# Patient Record
Sex: Female | Born: 1975 | Race: White | Hispanic: No | Marital: Married | Smoking: Never smoker
Health system: Southern US, Community
[De-identification: ages and names within clinical notes are randomized; demographics above are authoritative.]

## PROBLEM LIST (undated history)

## (undated) HISTORY — PX: TUBAL LIGATION: SHX77

---

## 2001-04-25 ENCOUNTER — Emergency Department (HOSPITAL_COMMUNITY): Admission: EM | Admit: 2001-04-25 | Discharge: 2001-04-25 | Payer: Self-pay | Admitting: *Deleted

## 2001-04-25 ENCOUNTER — Encounter: Payer: Self-pay | Admitting: *Deleted

## 2001-12-09 ENCOUNTER — Encounter: Payer: Self-pay | Admitting: *Deleted

## 2001-12-09 ENCOUNTER — Ambulatory Visit (HOSPITAL_COMMUNITY): Admission: RE | Admit: 2001-12-09 | Discharge: 2001-12-09 | Payer: Self-pay | Admitting: *Deleted

## 2002-02-11 ENCOUNTER — Ambulatory Visit (HOSPITAL_COMMUNITY): Admission: RE | Admit: 2002-02-11 | Discharge: 2002-02-11 | Payer: Self-pay | Admitting: *Deleted

## 2002-03-10 ENCOUNTER — Inpatient Hospital Stay (HOSPITAL_COMMUNITY): Admission: RE | Admit: 2002-03-10 | Discharge: 2002-03-12 | Payer: Self-pay | Admitting: *Deleted

## 2002-04-28 ENCOUNTER — Ambulatory Visit (HOSPITAL_COMMUNITY): Admission: RE | Admit: 2002-04-28 | Discharge: 2002-04-28 | Payer: Self-pay | Admitting: *Deleted

## 2016-06-29 ENCOUNTER — Emergency Department (HOSPITAL_COMMUNITY)
Admission: EM | Admit: 2016-06-29 | Discharge: 2016-06-29 | Disposition: A | Discharge status: Home / Self Care | Payer: PRIVATE HEALTH INSURANCE | Attending: Emergency Medicine | Admitting: Emergency Medicine

## 2016-06-29 ENCOUNTER — Encounter (HOSPITAL_COMMUNITY): Payer: Self-pay

## 2016-06-29 ENCOUNTER — Emergency Department (HOSPITAL_COMMUNITY): Payer: PRIVATE HEALTH INSURANCE

## 2016-06-29 DIAGNOSIS — S86912A Strain of unspecified muscle(s) and tendon(s) at lower leg level, left leg, initial encounter: Secondary | ICD-10-CM | POA: Diagnosis not present

## 2016-06-29 DIAGNOSIS — Y9389 Activity, other specified: Secondary | ICD-10-CM | POA: Insufficient documentation

## 2016-06-29 DIAGNOSIS — Y999 Unspecified external cause status: Secondary | ICD-10-CM | POA: Diagnosis not present

## 2016-06-29 DIAGNOSIS — W109XXA Fall (on) (from) unspecified stairs and steps, initial encounter: Secondary | ICD-10-CM | POA: Diagnosis not present

## 2016-06-29 DIAGNOSIS — S93402A Sprain of unspecified ligament of left ankle, initial encounter: Secondary | ICD-10-CM | POA: Insufficient documentation

## 2016-06-29 DIAGNOSIS — Y929 Unspecified place or not applicable: Secondary | ICD-10-CM | POA: Insufficient documentation

## 2016-06-29 DIAGNOSIS — S99912A Unspecified injury of left ankle, initial encounter: Secondary | ICD-10-CM | POA: Diagnosis present

## 2016-06-29 MED ORDER — IBUPROFEN 600 MG PO TABS: 600.0000 mg | ORAL_TABLET | Freq: Four times a day (QID) | ORAL | 0 refills | Status: DC

## 2016-06-29 MED ORDER — PROMETHAZINE HCL 12.5 MG PO TABS
25.0000 mg | ORAL_TABLET | Freq: Once | ORAL | Status: AC
  Administered 2016-06-29: 25 mg via ORAL
  Filled 2016-06-29: qty 2

## 2016-06-29 MED ORDER — HYDROCODONE-ACETAMINOPHEN 5-325 MG PO TABS: 1.0000 | ORAL_TABLET | ORAL | 0 refills | Status: DC | PRN

## 2016-06-29 MED ORDER — HYDROCODONE-ACETAMINOPHEN 5-325 MG PO TABS
2.0000 | ORAL_TABLET | Freq: Once | ORAL | Status: AC
  Administered 2016-06-29: 2 via ORAL
  Filled 2016-06-29: qty 2

## 2016-06-29 MED ORDER — PROMETHAZINE HCL 12.5 MG PO TABS: 12.5000 mg | ORAL_TABLET | Freq: Four times a day (QID) | ORAL | 0 refills | Status: DC | PRN

## 2016-06-29 MED ORDER — IBUPROFEN 800 MG PO TABS
800.0000 mg | ORAL_TABLET | Freq: Once | ORAL | Status: AC
Start: 2016-06-29 — End: 2016-06-29
  Administered 2016-06-29: 800 mg via ORAL
  Filled 2016-06-29: qty 1

## 2016-06-29 NOTE — Discharge Instructions (Signed)
Please use the ankle stirrup splint in the knee immobilizer until seen by Dr. Romeo AppleHarrison. Please use the crutches until you can safely apply weight to your lower extremity. Please use the ice pack for the next 48 hours. Keep your left lower extremity elevated above your waist. Use ibuprofen with each meal and at bedtime. May use Norco for more severe pain.This medication may cause drowsiness. Please do not drink, drive, or participate in activity that requires concentration while taking this medication.

## 2016-06-29 NOTE — ED Triage Notes (Signed)
Pt reports she was carrying a 50lb bag of chicken feed today and fell.  C/O pain and swelling to left knee and left ankle. Pedal pulse present.  Abrasion noted to left top of foot.

## 2016-06-29 NOTE — ED Provider Notes (Signed)
[facility] Provider Note   CSN: [id number] Arrival date & time: 06/29/16  1127     History   Chief Complaint Chief Complaint  Patient presents with  . Fall    HPI Kaitlyn Watkins is a 40 y.o. female.  Patient is a 40 year old female who presents to the emergency department with a complaint of left lower extremity pain.  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 Her foot slipped on one of the steps, and she injured the left knee and ankle. She states that when she attempts to put weight on it since this incident she has excruciating pain. She also has severe pain with the 2 areas even at rest. She has not had any previous operations or procedures involving the left lower extremity. She denies any anticoagulation medications, and she has no history of bleeding disorders. She denies any other injury. Movement and standing seem to cause the pain to be worse. Nothing really seems to make the pain any better.      History reviewed. No pertinent past medical history.  There are no active problems to display for this patient.   Past Surgical History:  Procedure Laterality Date  . TUBAL LIGATION      OB History    No data available       Home Medications    Prior to Admission medications   Not on File    Family History No family history on file.  Social History Social History  Substance Use Topics  . Smoking status: Never Smoker  . Smokeless tobacco: Never Used  . Alcohol use Not on file     Comment: occ     Allergies   Review of patient's allergies indicates no known allergies.   Review of Systems Review of Systems  Musculoskeletal: Positive for arthralgias.  All other systems reviewed and are negative.    Physical Exam Updated Vital Signs BP 132/76 (BP Location: Left Arm)   Pulse 75   Temp 98.2 F (36.8 C) (Oral)   Resp 20   Ht 5\' 4"  (1.626 m)   Wt 86.2 kg   LMP 06/17/2016    SpO2 100%   BMI 32.61 kg/m   Physical Exam  Constitutional: She is oriented to person, place, and time. She appears well-developed and well-nourished.  Non-toxic appearance.  HENT:  Head: Normocephalic.  Right Ear: Tympanic membrane and external ear normal.  Left Ear: Tympanic membrane and external ear normal.  Eyes: EOM and lids are normal. Pupils are equal, round, and reactive to light.  Neck: Normal range of motion. Neck supple. Carotid bruit is not present.  Cardiovascular: Normal rate, regular rhythm, normal heart sounds, intact distal pulses and normal pulses.   Pulmonary/Chest: Breath sounds normal. No respiratory distress.  Abdominal: Soft. Bowel sounds are normal. There is no tenderness. There is no guarding.  Musculoskeletal:       Left knee: She exhibits decreased range of motion. Tenderness found. Medial joint line and lateral joint line tenderness noted.       Left ankle: She exhibits no deformity. Tenderness. Medial malleolus tenderness found. Achilles tendon exhibits pain. Achilles tendon exhibits no defect.       Legs:      Feet:  Lymphadenopathy:       Head (right side): No submandibular adenopathy present.       Head (left side): No submandibular adenopathy present.    She has no cervical adenopathy.  Neurological: She is alert and oriented to person, place, and time. She has normal strength. No cranial nerve deficit or sensory deficit.  Skin: Skin is warm and dry.  Psychiatric: She has a normal mood and affect. Her speech is normal.  Nursing note and vitals reviewed.    ED Treatments / Results  Labs (all labs ordered are listed, but only abnormal results are displayed) Labs Reviewed - No data to display  EKG  EKG Interpretation None       Radiology No results found.  Procedures Procedures (including critical care time)  Medications Ordered in ED Medications  promethazine (PHENERGAN) tablet 25 mg (not administered)  HYDROcodone-acetaminophen  (NORCO/VICODIN) 5-325 MG per tablet 2 tablet (not administered)  ibuprofen (ADVIL,MOTRIN) tablet 800 mg (not administered)     Initial Impression / Assessment and Plan / ED Course  I have reviewed the triage vital signs and the nursing notes.  Pertinent labs & imaging results that were available during my care of the patient were reviewed by me and considered in my medical decision making (see chart for details).  Clinical Course    **I have reviewed nursing notes, vital signs, and all appropriate lab and imaging results for this patient.*  Final Clinical Impressions(s) / ED Diagnoses  X-ray of the left knee and left ankle are negative for fracture or dislocation. There does appear to be a small effusion of the left ankle. The patient is fitted with an ankle stirrup splint, as well as a knee immobilizer. The patient is also fitted with crutches. Prescription for ibuprofen and Norco given to the patient. The patient will see Dr. Romeo AppleHarrison for orthopedic evaluation and follow-up concerning the knee and ankle. Patient is excused from work duty until September 28.    Final diagnoses:  Knee strain, left, initial encounter  Left ankle sprain, initial encounter    New Prescriptions New Prescriptions   No medications on file     Ivery QualeHobson Kash [place], PA-C 06/29/16 1341    Glynn OctaveStephen Rancour, MD 06/29/16 1754

## 2020-03-23 ENCOUNTER — Encounter (HOSPITAL_COMMUNITY): Payer: Self-pay | Admitting: *Deleted

## 2020-03-23 ENCOUNTER — Other Ambulatory Visit: Payer: Self-pay

## 2020-03-23 ENCOUNTER — Emergency Department (HOSPITAL_COMMUNITY)
Admission: EM | Admit: 2020-03-23 | Discharge: 2020-03-23 | Disposition: A | Discharge status: Home / Self Care | Payer: 59 | Attending: Emergency Medicine | Admitting: Emergency Medicine

## 2020-03-23 DIAGNOSIS — K8050 Calculus of bile duct without cholangitis or cholecystitis without obstruction: Secondary | ICD-10-CM | POA: Diagnosis not present

## 2020-03-23 DIAGNOSIS — Z20822 Contact with and (suspected) exposure to covid-19: Secondary | ICD-10-CM | POA: Insufficient documentation

## 2020-03-23 DIAGNOSIS — R1011 Right upper quadrant pain: Secondary | ICD-10-CM

## 2020-03-23 LAB — URINALYSIS, ROUTINE W REFLEX MICROSCOPIC
Bilirubin Urine: NEGATIVE
Glucose, UA: NEGATIVE mg/dL
Hgb urine dipstick: NEGATIVE
Ketones, ur: NEGATIVE mg/dL
Nitrite: NEGATIVE
Protein, ur: NEGATIVE mg/dL
Specific Gravity, Urine: 1.011 (ref 1.005–1.030)
pH: 7 (ref 5.0–8.0)

## 2020-03-23 LAB — CBC
HCT: 41.6 % (ref 36.0–46.0)
Hemoglobin: 13.5 g/dL (ref 12.0–15.0)
MCH: 28.4 pg (ref 26.0–34.0)
MCHC: 32.5 g/dL (ref 30.0–36.0)
MCV: 87.6 fL (ref 80.0–100.0)
Platelets: 250 10*3/uL (ref 150–400)
RBC: 4.75 MIL/uL (ref 3.87–5.11)
RDW: 13.4 % (ref 11.5–15.5)
WBC: 9.9 10*3/uL (ref 4.0–10.5)
nRBC: 0 % (ref 0.0–0.2)

## 2020-03-23 LAB — COMPREHENSIVE METABOLIC PANEL
ALT: 19 U/L (ref 0–44)
AST: 15 U/L (ref 15–41)
Albumin: 4.7 g/dL (ref 3.5–5.0)
Alkaline Phosphatase: 56 U/L (ref 38–126)
Anion gap: 10 (ref 5–15)
BUN: 13 mg/dL (ref 6–20)
CO2: 21 mmol/L — ABNORMAL LOW (ref 22–32)
Calcium: 9.6 mg/dL (ref 8.9–10.3)
Chloride: 108 mmol/L (ref 98–111)
Creatinine, Ser: 0.64 mg/dL (ref 0.44–1.00)
GFR calc Af Amer: >60 mL/min (ref >60)
GFR calc non Af Amer: >60 mL/min (ref >60)
Glucose, Bld: 86 mg/dL (ref 70–99)
Potassium: 3.3 mmol/L — ABNORMAL LOW (ref 3.5–5.1)
Sodium: 139 mmol/L (ref 135–145)
Total Bilirubin: 0.6 mg/dL (ref 0.3–1.2)
Total Protein: 7.8 g/dL (ref 6.5–8.1)

## 2020-03-23 LAB — SARS CORONAVIRUS 2 BY RT PCR (HOSPITAL ORDER, PERFORMED IN ~~LOC~~ HOSPITAL LAB): SARS Coronavirus 2: NEGATIVE

## 2020-03-23 LAB — LIPASE, BLOOD: Lipase: 27 U/L (ref 11–51)

## 2020-03-23 LAB — PREGNANCY, URINE: Preg Test, Ur: NEGATIVE

## 2020-03-23 MED ORDER — ONDANSETRON HCL 4 MG/2ML IJ SOLN
4.0000 mg | Freq: Once | INTRAMUSCULAR | Status: AC
  Administered 2020-03-23: 4 mg via INTRAVENOUS
  Filled 2020-03-23: qty 2

## 2020-03-23 MED ORDER — FENTANYL CITRATE (PF) 100 MCG/2ML IJ SOLN
50.0000 ug | Freq: Once | INTRAMUSCULAR | Status: AC
  Administered 2020-03-23: 50 ug via INTRAVENOUS
  Filled 2020-03-23: qty 2

## 2020-03-23 MED ORDER — SODIUM CHLORIDE 0.9% FLUSH: 3.0000 mL | Freq: Once | INTRAVENOUS | Status: DC

## 2020-03-23 NOTE — ED Triage Notes (Signed)
Pt states she was at work earlier today and started to have severe right upper abdominal pain; pt states the pain is intermittent and states she has had nausea

## 2020-03-23 NOTE — ED Provider Notes (Signed)
[facility] Provider Note   CSN: [id number] Arrival date & time: 03/23/20  1536     History Chief Complaint  Patient presents with  . Abdominal Pain    Kaitlyn Watkins is a 44 y.o. female who presents emergency department with chief complaint of right upper quadrant abdominal pain.  Patient states that she was at work this morning when she started getting right upper quadrant pain which has progressively worsened.  It is constant, colicky, severe.  She has had associated nausea without active vomiting.  Patient states that she has had on and off episodes of similar symptoms for the past several years however this is the worst episode she has had.  Her husband who is at bedside and provides history states "I think it is her gallbladder."  She denies a history of acid reflux.  The pain does not radiate.  It is not worse with deep breathing.  She seems to get a little relief when she applies pressure to the area.  Her pain has improved but is still moderate to severe.  She denies flank pain, fevers, urinary symptoms, diarrhea or constipation.  HPI     History reviewed. No pertinent past medical history.  There are no problems to display for this patient.   Past Surgical History:  Procedure Laterality Date  . TUBAL LIGATION       OB History   No obstetric history on file.     History reviewed. No pertinent family history.  Social History   Tobacco Use  . Smoking status: Never Smoker  . Smokeless tobacco: Never Used  Substance Use Topics  . Alcohol use: Yes    Comment: occ  . Drug use: No    Home Medications Prior to Admission medications   Medication Sig Start Date End Date Taking? Authorizing Provider  HYDROcodone-acetaminophen (NORCO/VICODIN) 5-325 MG tablet Take 1 tablet by mouth every 4 (four) hours as needed. 06/29/16   Lily Kocher, PA-C  ibuprofen (ADVIL,MOTRIN) 600 MG tablet Take 1 tablet (600 mg total) by mouth 4 (four) times  daily. Take with breakfast, lunch, supper, and at hs 06/29/16   Lily Kocher, PA-C  promethazine (PHENERGAN) 12.5 MG tablet Take 1 tablet (12.5 mg total) by mouth every 6 (six) hours as needed for nausea or vomiting. 06/29/16   Lily Kocher, PA-C    Allergies    Patient has no known allergies.  Review of Systems   Review of Systems Ten systems reviewed and are negative for acute change, except as noted in the HPI.   Physical Exam Updated Vital Signs BP 134/83   Pulse (!) 55   Temp 98.2 F (36.8 C) (Oral)   Resp 18   Ht 5\' 4"  (1.626 m)   Wt 90.7 kg   LMP 02/17/2020   SpO2 100%   BMI 34.33 kg/m   Physical Exam Vitals and nursing note reviewed.  Constitutional:      General: She is not in acute distress.    Appearance: She is well-developed. She is not diaphoretic.  HENT:     Head: Normocephalic and atraumatic.  Eyes:     General: No scleral icterus.    Conjunctiva/sclera: Conjunctivae normal.  Cardiovascular:     Rate and Rhythm: Normal rate and regular rhythm.     Heart sounds: Normal heart sounds. No murmur heard.  No friction rub. No gallop.   Pulmonary:     Effort: Pulmonary effort is normal. No respiratory distress.     Breath  sounds: Normal breath sounds.  Abdominal:     General: Bowel sounds are normal. There is no distension.     Palpations: Abdomen is soft. There is no mass.     Tenderness: There is abdominal tenderness in the right upper quadrant. There is no right CVA tenderness, left CVA tenderness or guarding. Positive signs include Murphy's sign.  Musculoskeletal:     Cervical back: Normal range of motion.  Skin:    General: Skin is warm and dry.  Neurological:     Mental Status: She is alert and oriented to person, place, and time.  Psychiatric:        Behavior: Behavior normal.     ED Results / Procedures / Treatments   Labs (all labs ordered are listed, but only abnormal results are displayed) Labs Reviewed  COMPREHENSIVE METABOLIC PANEL -  Abnormal; Notable for the following components:      Result Value   Potassium 3.3 (*)    CO2 21 (*)    All other components within normal limits  URINALYSIS, ROUTINE W REFLEX MICROSCOPIC - Abnormal; Notable for the following components:   Leukocytes,Ua SMALL (*)    Bacteria, UA RARE (*)    All other components within normal limits  SARS CORONAVIRUS 2 BY RT PCR (HOSPITAL ORDER, PERFORMED IN [facility])  LIPASE, BLOOD  CBC  PREGNANCY, URINE    EKG None  Radiology US ABDOMEN LIMITED RUQ  Result Date: 03/24/2020 CLINICAL DATA:  Recurrent right upper quadrant pain EXAM: ULTRASOUND ABDOMEN LIMITED RIGHT UPPER QUADRANT COMPARISON:  None. FINDINGS: Gallbladder: No gallstones or wall thickening visualized. No sonographic Murphy sign noted by sonographer. Common bile duct: Diameter: 5.6 mm Liver: No focal lesion identified. Within normal limits in parenchymal echogenicity. Portal vein is patent on color Doppler imaging with normal direction of blood flow towards the liver. Other: None. IMPRESSION: 1. Normal exam. Electronically Signed   By: Signa Kell M.D.   On: 03/24/2020 11:31    Procedures Procedures (including critical care time)  Medications Ordered in ED Medications  fentaNYL (SUBLIMAZE) injection 50 mcg (50 mcg Intravenous Given 03/23/20 1826)  ondansetron (ZOFRAN) injection 4 mg (4 mg Intravenous Given 03/23/20 1824)    ED Course  I have reviewed the triage vital signs and the nursing notes.  Pertinent labs & imaging results that were available during my care of the patient were reviewed by me and considered in my medical decision making (see chart for details).    MDM Rules/Calculators/A&P                           This patient complains of RUQ abd pain, this involves an extensive number of treatment options, and is a complaint that carries with it a high risk of complications and morbidity.  The differential diagnosis includes The emergent DDX for RUQ pain  includes but is not limited to Glabladder disease, PUD, Acute Hepatitis, Pancreatitis, pyelonephritis, Pneumonia, Lower lobe PE/Infarct, Kidney stone, GERD, retrocecal appendicitis, Fitz-Hugh-Curtis syndrome, AAA, MI, Zoster.   I Ordered, reviewed, and interpreted labs, which included CBC, pregnancy test, Covid test, lipase which are all within normal limits.  CMP shows a mildly decreased potassium level.  AST and ALT along with alkaline phosphatase also within normal limits. I ordered medication fentanyl and Zofran for pain and nausea I ordered imaging studies which included right upper quadrant abdominal ultrasound, however ultrasound unavailable at this time.  Additional history obtained from patient's husband  at bedside Previous records obtained and reviewed    Critical interventions: Pain medication, nausea medication  After the interventions stated above, I reevaluated the patient and found near complete resolution of patient's symptoms   Patient symptoms are concerning for biliary colic.  In shared decision-making the patient declined CT scan and would like to return tomorrow morning for right upper quadrant abdominal ultrasound.  Given the lab findings and resolution of pain I feel that this is okay and that her symptoms do not warrant emergent intervention.  She is not actively vomiting.  She has no elevation in her liver enzymes.  The patient will return tomorrow for evaluation of her gallbladder with right upper quadrant ultrasound.  I have given her referral to general surgery.  They may wish to have additional testing such as a HIDA scan.  I discussed this with the patient.  Patient advised to not go long periods of time without eating and to decrease fatty intake.  Patient appears otherwise appropriate for discharge with follow-up ultrasound tomorrow morning.  Discussed return precautions.  Final Clinical Impression(s) / ED Diagnoses Final diagnoses:  RUQ abdominal pain  Biliary  colic    Rx / DC Orders ED Discharge Orders         Ordered    US ABDOMEN LIMITED RUQ     Discontinue     03/23/20 1945           Arthor Captain, PA-C 03/24/20 1315    Jacalyn Lefevre, MD 03/24/20 1500

## 2020-03-23 NOTE — Discharge Instructions (Signed)
Contact a health care provider if: Your pain lasts more than 5 hours. You vomit. You have a fever and chills. Your pain gets worse. Get help right away if: Your skin or the whites of your eyes look yellow (jaundice). Your have tea-colored urine and light-colored stools. You are dizzy or you faint. 

## 2020-03-24 ENCOUNTER — Ambulatory Visit (HOSPITAL_COMMUNITY)
Admission: RE | Admit: 2020-03-24 | Discharge: 2020-03-24 | Disposition: A | Discharge status: Home / Self Care | Payer: 59 | Source: Ambulatory Visit | Attending: Emergency Medicine | Admitting: Emergency Medicine

## 2020-03-24 DIAGNOSIS — R1011 Right upper quadrant pain: Secondary | ICD-10-CM | POA: Diagnosis present

## 2020-03-24 IMAGING — US US ABDOMEN LIMITED
1 series · 14 of 25 positions shown · non-contrast
Comparison: None.

CLINICAL DATA: Recurrent right upper quadrant pain

EXAM:
ULTRASOUND ABDOMEN LIMITED RIGHT UPPER QUADRANT

[Series 1: us abdomen limited · 14 of 48 slices shown]
[im 1/48]
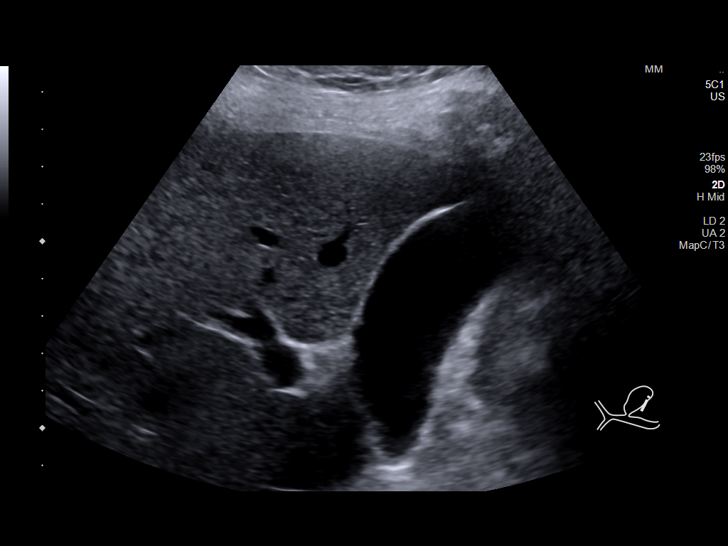
[im 4/48]
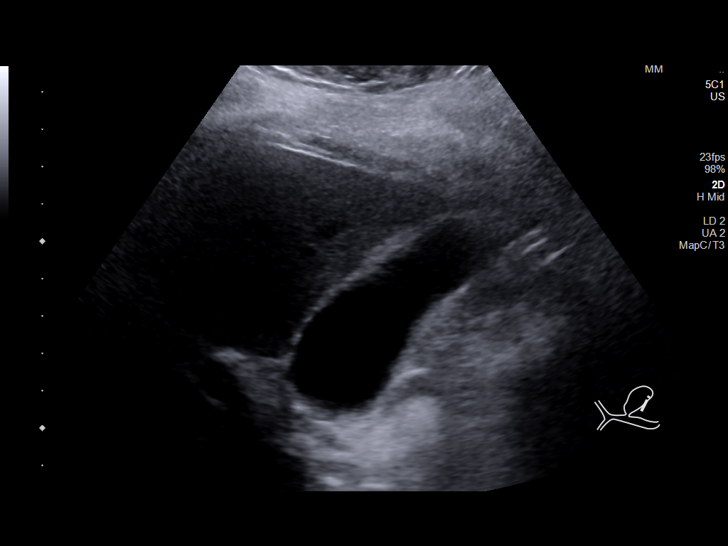
[im 8/48]
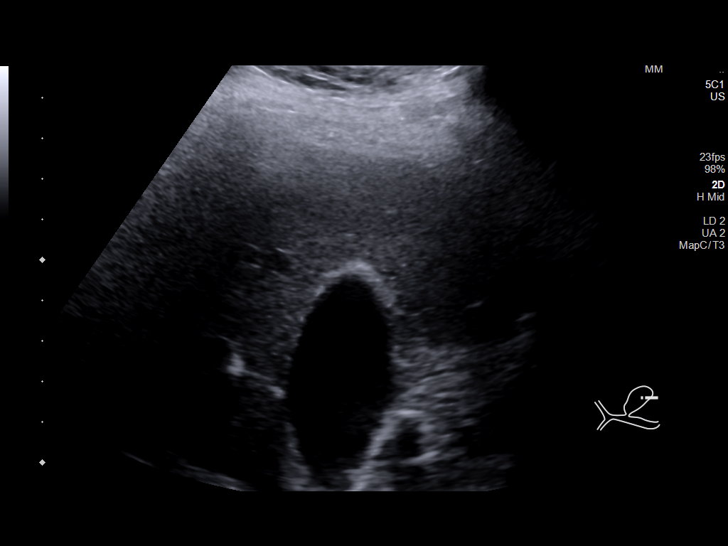
[im 12/48]
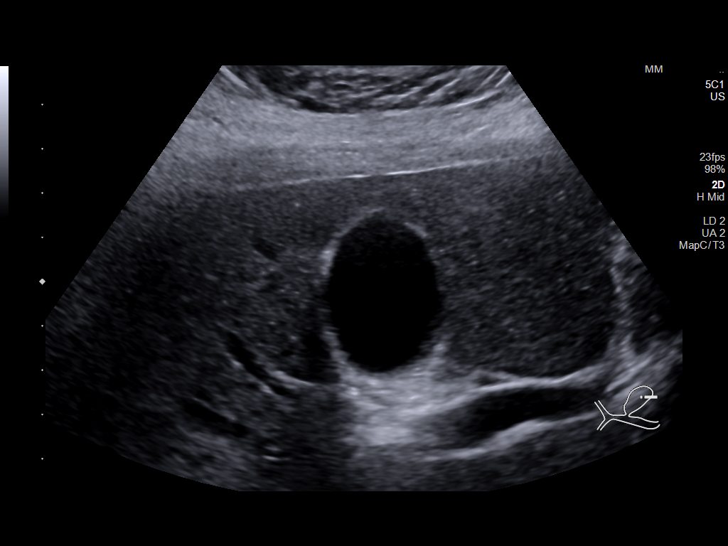
[im 16/48]
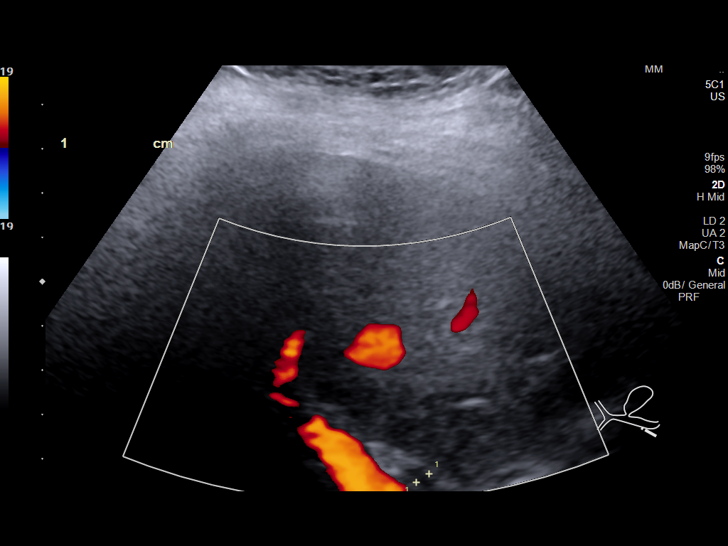
[im 18/48]
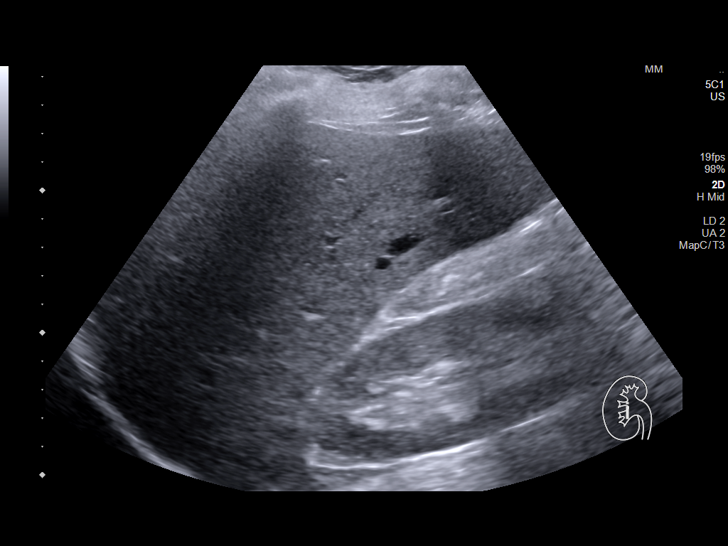
[im 22/48]
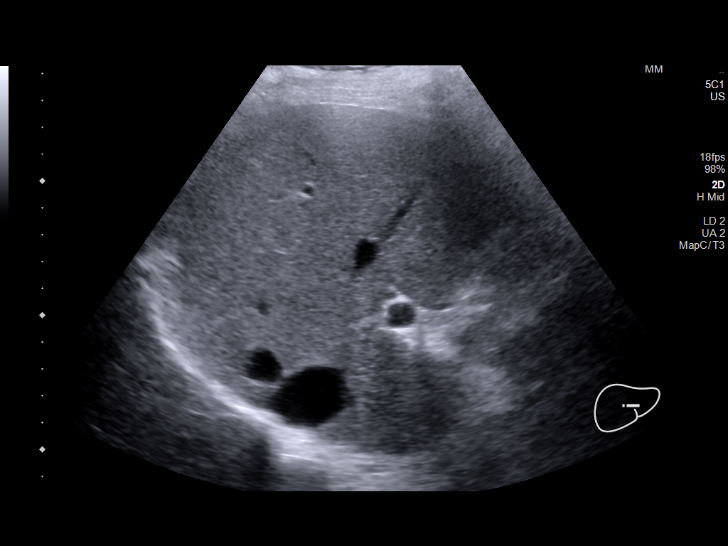
[im 26/48]
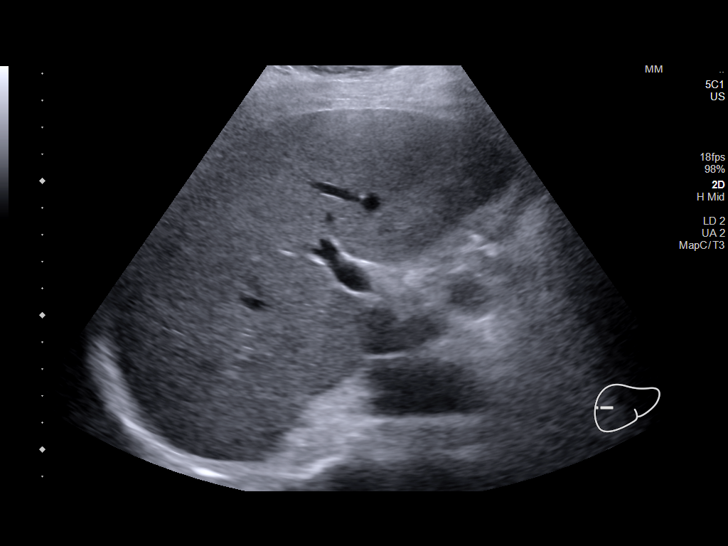
[im 30/48]
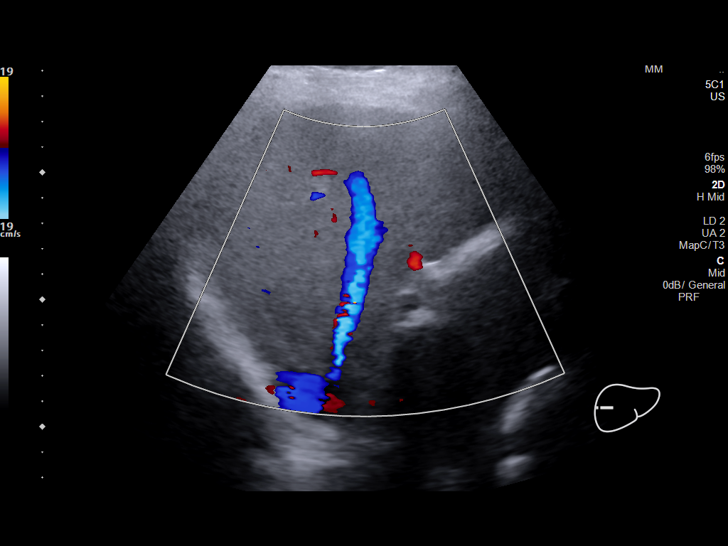
[im 32/48]
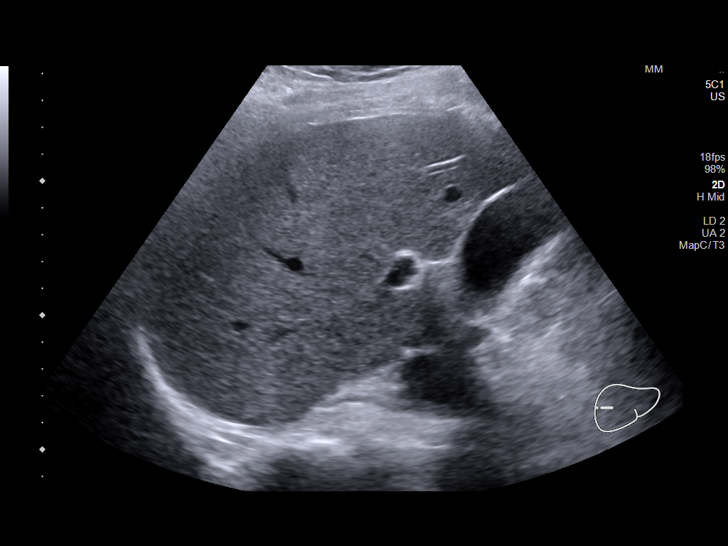
[im 36/48]
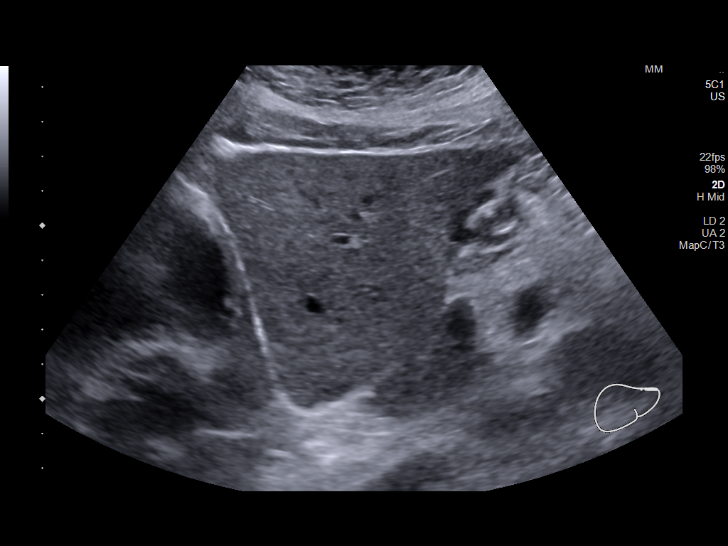
[im 40/48]
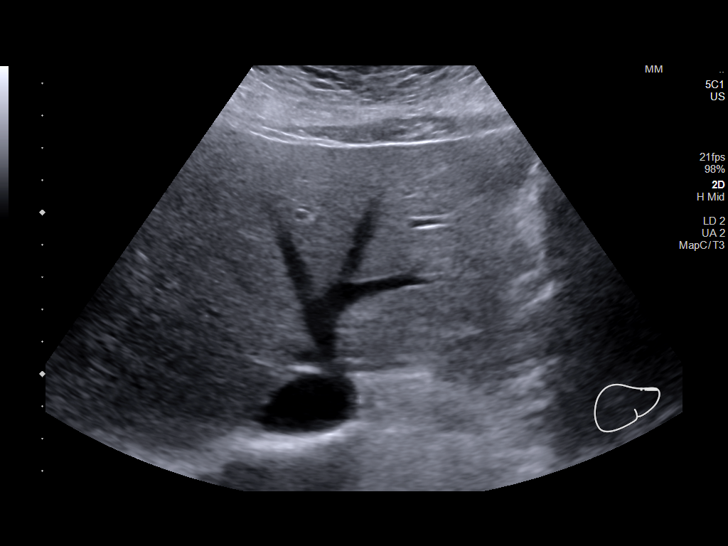
[im 44/48]
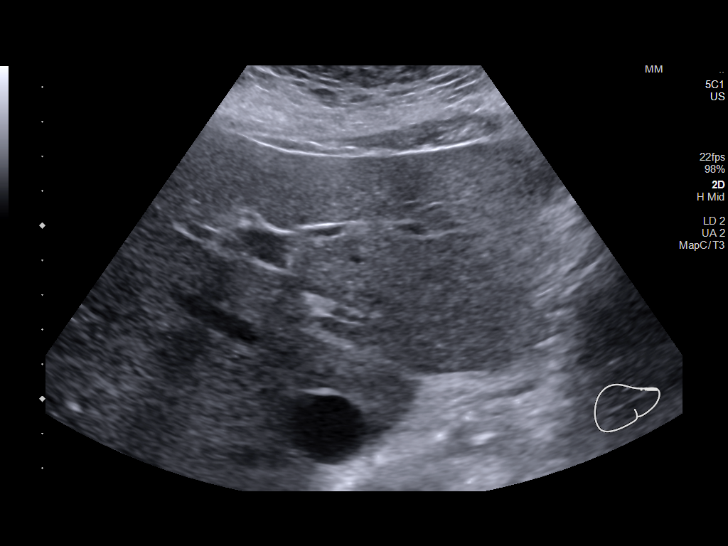
[im 48/48]
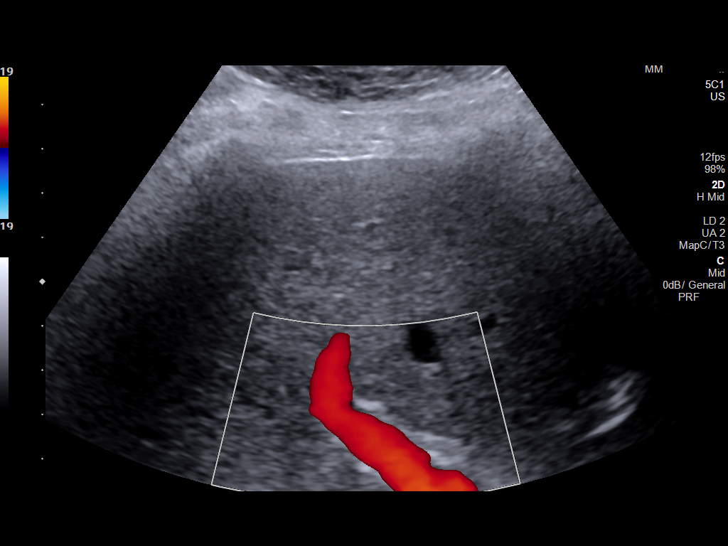

[14 of 25 positions shown; findings below may reference images not displayed]

FINDINGS: Gallbladder:

No gallstones or wall thickening visualized. No sonographic Murphy
sign noted by sonographer.

Common bile duct:

Diameter: 5.6 mm

Liver:

No focal lesion identified. Within normal limits in parenchymal
echogenicity. Portal vein is patent on color Doppler imaging with
normal direction of blood flow towards the liver.

Other: None.
IMPRESSION: 1. Normal exam.

## 2020-03-24 NOTE — ED Provider Notes (Signed)
Patient return for follow-up ultrasound ordered by Kaitlyn Watkins.  I personally reviewed her imaging results.  Right upper quadrant ultrasound was without any acute abnormalities, no stones noted, normal Doppler flow.  Previous provider had referred her to general surgery for follow-up.  When discussing with the patient, she states that she has already called and made an appointment with them reports mild soreness to the right upper quadrant but significant decrease in pain..  I encouraged her to keep this appointment for further evaluation.  Patient voices understanding and is agreeable.  Patient is stable for DC to home.   Mare Ferrari, PA-C 03/24/20 1213    Vanetta Mulders, MD 03/25/20 1310

## 2020-04-05 ENCOUNTER — Encounter: Payer: Self-pay | Admitting: General Surgery

## 2020-04-05 ENCOUNTER — Other Ambulatory Visit: Payer: Self-pay

## 2020-04-05 ENCOUNTER — Ambulatory Visit (INDEPENDENT_AMBULATORY_CARE_PROVIDER_SITE_OTHER): Payer: PRIVATE HEALTH INSURANCE | Admitting: General Surgery

## 2020-04-05 VITALS — BP 125/75 | HR 68 | Temp 96.9°F | Resp 14 | Ht 64.0 in | Wt 200.0 lb

## 2020-04-05 DIAGNOSIS — R1011 Right upper quadrant pain: Secondary | ICD-10-CM | POA: Diagnosis not present

## 2020-04-05 NOTE — Patient Instructions (Signed)
Biliary Colic, Adult  Biliary colic is severe pain caused by a problem with a small organ in the upper right part of your belly (gallbladder). The gallbladder stores a digestive fluid produced in the liver (bile) that helps the body break down fat. Bile and other digestive enzymes are carried from the liver to the small intestine through tube-like structures (bile ducts). The gallbladder and the bile ducts form the biliary tract. Sometimes hard deposits of digestive fluids form in the gallbladder (gallstones) and block the flow of bile from the gallbladder, causing biliary colic. This condition is also called a gallbladder attack. Gallstones can be as small as a grain of sand or as big as a golf ball. There could be just one gallstone in the gallbladder, or there could be many. What are the causes? Biliary colic is usually caused by gallstones. Less often, a tumor could block the flow of bile from the gallbladder and trigger biliary colic. What increases the risk? This condition is more likely to develop in:  Women.  People of Hispanic descent.  People with a family history of gallstones.  People who are obese.  People who suddenly or quickly lose weight.  People who eat a high-calorie, low-fiber diet that is rich in refined carbs (carbohydrates), such as white bread and white rice.  People who have an intestinal disease that affects nutrient absorption, such as Crohn disease.  People who have a metabolic condition, such as metabolic syndrome or diabetes. What are the signs or symptoms? Severe pain in the upper right side of the belly is the main symptom of biliary colic. You may feel this pain below the chest but above the hip. This pain often occurs at night or after eating a very fatty meal. This pain may get worse for up to an hour and last as long as 12 hours. In most cases, the pain fades (subsides) within a couple hours. Other symptoms of this condition include:  Nausea and  vomiting.  Pain under the right shoulder. How is this diagnosed? This condition is diagnosed based on your medical history, your symptoms, and a physical exam. You may have tests, including:  Blood tests to rule out infection or inflammation of the bile ducts, gallbladder, pancreas, or liver.  Imaging studies such as: ? Ultrasound. ? CT scan. ? MRI. In some cases, you may need to have an imaging study done using a small amount of radioactive material (nuclear medicine) to confirm the diagnosis. How is this treated? Treatment for this condition may include medicine to relieve your pain or nausea. If you have gallstones that are causing biliary colic, you may need surgery to remove the gallbladder (cholecystectomy). Gallstones can also be dissolved gradually with medicine. It may take months or years before the gallstones are completely gone. Follow these instructions at home:  Take over-the-counter and prescription medicines only as told by your health care provider.  Drink enough fluid to keep your urine clear or pale yellow.  Follow instructions from your health care provider about eating or drinking restrictions. These may include avoiding: ? Fatty, greasy, and fried foods. ? Any foods that make the pain worse. ? Overeating. ? Having a large meal after not eating for a while.  Keep all follow-up visits as told by your health care provider. This is important. How is this prevented? Steps to prevent this condition include:  Maintaining a healthy body weight.  Getting regular exercise.  Eating a healthy, high-fiber, low-fat diet.  Limiting how much   sugar and refined carbs you eat, such as sweets, white flour, and white rice. Contact a health care provider if:  Your pain lasts more than 5 hours.  You vomit.  You have a fever and chills.  Your pain gets worse. Get help right away if:  Your skin or the whites of your eyes look yellow (jaundice).  Your have tea-colored  urine and light-colored stools.  You are dizzy or you faint. Summary  Biliary colic is severe pain caused by a problem with a small organ in the upper right part of your belly (gallbladder).  Treatments for this condition include medicines that relieves your pain or nausea and medicines that slowly dissolves the gallstones.  If gallstones cause your biliary colic, the treatment is surgery to remove the gallbladder (cholecystectomy). This information is not intended to replace advice given to you by your health care provider. Make sure you discuss any questions you have with your health care provider. Document Revised: 09/06/2017 Document Reviewed: 04/09/2016 Elsevier Patient Education  2020 [employer]  

## 2020-04-06 ENCOUNTER — Telehealth: Payer: Self-pay | Admitting: Family Medicine

## 2020-04-06 NOTE — Progress Notes (Signed)
Kaitlyn Watkins; [id number]; [birth year]   HPI Patient is a 44 year old white female who was referred to my care by Emilio Math of the [facility] for evaluation treatment of recurrent right upper quadrant abdominal pain.  Patient was recently seen in the emergency room for right upper quadrant abdominal pain.  Ultrasound of the gallbladder was negative.  She has had persistent and more frequent episodes of right upper quadrant abdominal pain which radiate around her right flank to her back, bloating, and indigestion along with fatty food intolerance.  She has decreased the amount of fatty foods that she eats.  She has limited her diet to try to prevent recurrent episodes.  She denies any fever, chills, or jaundice.  She currently has minimal abdominal pain. History reviewed. No pertinent past medical history.  Past Surgical History:  Procedure Laterality Date  . TUBAL LIGATION      Family History  Problem Relation Age of Onset  . Hypertension Mother   . Diabetes Father   . CAD Father     Current Outpatient Medications on File Prior to Visit  Medication Sig Dispense Refill  . diphenhydrAMINE (BENADRYL) 25 MG tablet Take 25 mg by mouth daily.     No current facility-administered medications on file prior to visit.    No Known Allergies  Social History   Substance and Sexual Activity  Alcohol Use Yes   Comment: occ    Social History   Tobacco Use  Smoking Status Never Smoker  Smokeless Tobacco Never Used    Review of Systems  Constitutional: Negative.   HENT: Positive for sinus pain.   Eyes: Negative.   Respiratory: Negative.   Cardiovascular: Negative.   Gastrointestinal: Positive for heartburn and nausea.  Genitourinary: Negative.   Musculoskeletal: Positive for joint pain.  Skin: Negative.   Neurological: Negative.   Endo/Heme/Allergies: Negative.   Psychiatric/Behavioral: Negative.     Objective   Vitals:   04/05/20 1325  BP:  125/75  Pulse: 68  Resp: 14  Temp: (!) 96.9 F (36.1 C)  SpO2: 97%    Physical Exam Vitals reviewed.  Constitutional:      Appearance: Normal appearance. She is not ill-appearing.  HENT:     Head: Normocephalic and atraumatic.  Eyes:     General: No scleral icterus. Cardiovascular:     Rate and Rhythm: Normal rate and regular rhythm.     Heart sounds: Normal heart sounds. No murmur heard.  No friction rub. No gallop.   Pulmonary:     Effort: Pulmonary effort is normal. No respiratory distress.     Breath sounds: Normal breath sounds. No stridor. No wheezing, rhonchi or rales.  Abdominal:     General: Bowel sounds are normal. There is no distension.     Palpations: Abdomen is soft. There is no mass.     Tenderness: There is abdominal tenderness. There is no guarding or rebound.     Hernia: No hernia is present.     Comments: Minimal tenderness to deep palpation in the right upper quadrant.  Skin:    General: Skin is warm and dry.  Neurological:     Mental Status: She is alert and oriented to person, place, and time.   ER notes, ultrasound report reviewed  Assessment  Right upper quadrant abdominal pain.  Ultrasound negative for cholelithiasis Plan   We will get HIDA scan with ejection fraction to assess the gallbladder.  Further management is pending those results.

## 2020-04-06 NOTE — Telephone Encounter (Signed)
Called insurance to get precert for Hida Scan CPT# 28979 - spoke to [place] with ref# [id number]. She transferred me to [facility] s/t Renee C. She states that this does not need precert and auth# is [id number]/30/2021. Called and schedule apt for scan.Pt is scheduled for 04/12/2020 at 10 Am NPO after midnight. Pt called and made aware.

## 2020-04-12 ENCOUNTER — Other Ambulatory Visit: Payer: Self-pay

## 2020-04-12 ENCOUNTER — Encounter (HOSPITAL_COMMUNITY): Payer: Self-pay

## 2020-04-12 ENCOUNTER — Ambulatory Visit (HOSPITAL_COMMUNITY)
Admission: RE | Admit: 2020-04-12 | Discharge: 2020-04-12 | Disposition: A | Discharge status: Home / Self Care | Payer: 59 | Source: Ambulatory Visit | Attending: General Surgery | Admitting: General Surgery

## 2020-04-12 DIAGNOSIS — R1011 Right upper quadrant pain: Secondary | ICD-10-CM | POA: Diagnosis present

## 2020-04-12 MED ORDER — TECHNETIUM TC 99M MEBROFENIN IV KIT
5.0000 | PACK | Freq: Once | INTRAVENOUS | Status: AC | PRN
  Administered 2020-04-12: 5.3 via INTRAVENOUS

## 2020-04-14 NOTE — H&P (Signed)
Kaitlyn Watkins; [id number]; [birth year]   HPI Patient is a 44 year old white female who was referred to my care by Emilio Math of the [facility] for evaluation treatment of recurrent right upper quadrant abdominal pain.  Patient was recently seen in the emergency room for right upper quadrant abdominal pain.  Ultrasound of the gallbladder was negative.  She has had persistent and more frequent episodes of right upper quadrant abdominal pain which radiate around her right flank to her back, bloating, and indigestion along with fatty food intolerance.  She has decreased the amount of fatty foods that she eats.  She has limited her diet to try to prevent recurrent episodes.  She denies any fever, chills, or jaundice.  She currently has minimal abdominal pain. History reviewed. No pertinent past medical history.  Past Surgical History:  Procedure Laterality Date  . TUBAL LIGATION      Family History  Problem Relation Age of Onset  . Hypertension Mother   . Diabetes Father   . CAD Father     Current Outpatient Medications on File Prior to Visit  Medication Sig Dispense Refill  . diphenhydrAMINE (BENADRYL) 25 MG tablet Take 25 mg by mouth daily.     No current facility-administered medications on file prior to visit.    No Known Allergies  Social History   Substance and Sexual Activity  Alcohol Use Yes   Comment: occ    Social History   Tobacco Use  Smoking Status Never Smoker  Smokeless Tobacco Never Used    Review of Systems  Constitutional: Negative.   HENT: Positive for sinus pain.   Eyes: Negative.   Respiratory: Negative.   Cardiovascular: Negative.   Gastrointestinal: Positive for heartburn and nausea.  Genitourinary: Negative.   Musculoskeletal: Positive for joint pain.  Skin: Negative.   Neurological: Negative.   Endo/Heme/Allergies: Negative.   Psychiatric/Behavioral: Negative.     Objective   Vitals:   04/05/20 1325  BP:  125/75  Pulse: 68  Resp: 14  Temp: (!) 96.9 F (36.1 C)  SpO2: 97%    Physical Exam Vitals reviewed.  Constitutional:      Appearance: Normal appearance. She is not ill-appearing.  HENT:     Head: Normocephalic and atraumatic.  Eyes:     General: No scleral icterus. Cardiovascular:     Rate and Rhythm: Normal rate and regular rhythm.     Heart sounds: Normal heart sounds. No murmur heard.  No friction rub. No gallop.   Pulmonary:     Effort: Pulmonary effort is normal. No respiratory distress.     Breath sounds: Normal breath sounds. No stridor. No wheezing, rhonchi or rales.  Abdominal:     General: Bowel sounds are normal. There is no distension.     Palpations: Abdomen is soft. There is no mass.     Tenderness: There is abdominal tenderness. There is no guarding or rebound.     Hernia: No hernia is present.     Comments: Minimal tenderness to deep palpation in the right upper quadrant.  Skin:    General: Skin is warm and dry.  Neurological:     Mental Status: She is alert and oriented to person, place, and time.   ER notes, ultrasound report reviewed  Assessment  Right upper quadrant abdominal pain.  Ultrasound negative for cholelithiasis Plan   We will get HIDA scan with ejection fraction to assess the gallbladder.  Further management is pending those results. Addendum: HIDA scan was positive  for chronic cholecystitis with a low gallbladder ejection fraction.  Patient is scheduled for laparoscopic cholecystectomy on 04/27/2020.  The risks and benefits of the procedure including bleeding, infection, hepatobiliary injury, and the possibility of an open procedure were fully explained to the patient, who gave informed consent.

## 2020-04-22 NOTE — Patient Instructions (Signed)
Kaitlyn Watkins  04/22/2020     @PREFPERIOPPHARMACY @   Your procedure is scheduled on  04/27/2020.  Report to 04/29/2020 at  [phone] A.M.  Call this number if you have problems the morning of surgery:  [phone]   Remember:  Do not eat or drink after midnight.                         Take these medicines the morning of surgery with A SIP OF WATER  None    Do not wear jewelry, make-up or nail polish.  Do not wear lotions, powders, or perfumes. Please wear deodorant and brush your teeth.  Do not shave 48 hours prior to surgery.  Men may shave face and neck.  Do not bring valuables to the hospital.  [facility] is not responsible for any belongings or valuables.  Contacts, dentures or bridgework may not be worn into surgery.  Leave your suitcase in the car.  After surgery it may be brought to your room.  For patients admitted to the hospital, discharge time will be determined by your treatment team.  Patients discharged the day of surgery will not be allowed to drive home.   Name and phone number of your driver:   family Special instructions:   DO NOT smoke the morning of your procedure.  Please read over the following fact sheets that you were given. Anesthesia Post-op Instructions and Care and Recovery After Surgery       Laparoscopic Cholecystectomy, Care After This sheet gives you information about how to care for yourself after your procedure. Your health care provider may also give you more specific instructions. If you have problems or questions, contact your health care provider. What can I expect after the procedure? After the procedure, it is common to have:  Pain at your incision sites. You will be given medicines to control this pain.  Mild nausea or vomiting.  Bloating and possible shoulder pain from the air-like gas that was used during the procedure. Follow these instructions at home: Incision care   Follow instructions from your  health care provider about how to take care of your incisions. Make sure you: ? Wash your hands with soap and water before you change your bandage (dressing). If soap and water are not available, use hand sanitizer. ? Change your dressing as told by your health care provider. ? Leave stitches (sutures), skin glue, or adhesive strips in place. These skin closures may need to be in place for 2 weeks or longer. If adhesive strip edges start to loosen and curl up, you may trim the loose edges. Do not remove adhesive strips completely unless your health care provider tells you to do that.  Do not take baths, swim, or use a hot tub until your health care provider approves. Ask your health care provider if you can take showers. You may only be allowed to take sponge baths for bathing.  Check your incision area every day for signs of infection. Check for: ? More redness, swelling, or pain. ? More fluid or blood. ? Warmth. ? Pus or a bad smell. Activity  Do not drive or use heavy machinery while taking prescription pain medicine.  Do not lift anything that is heavier than 10 lb (4.5 kg) until your health care provider approves.  Do not play contact sports until your health care provider approves.  Do not drive for 24  hours if you were given a medicine to help you relax (sedative).  Rest as needed. Do not return to work or school until your health care provider approves. General instructions  Take over-the-counter and prescription medicines only as told by your health care provider.  To prevent or treat constipation while you are taking prescription pain medicine, your health care provider may recommend that you: ? Drink enough fluid to keep your urine clear or pale yellow. ? Take over-the-counter or prescription medicines. ? Eat foods that are high in fiber, such as fresh fruits and vegetables, whole grains, and beans. ? Limit foods that are high in fat and processed sugars, such as fried and  sweet foods. Contact a health care provider if:  You develop a rash.  You have more redness, swelling, or pain around your incisions.  You have more fluid or blood coming from your incisions.  Your incisions feel warm to the touch.  You have pus or a bad smell coming from your incisions.  You have a fever.  One or more of your incisions breaks open. Get help right away if:  You have trouble breathing.  You have chest pain.  You have increasing pain in your shoulders.  You faint or feel dizzy when you stand.  You have severe pain in your abdomen.  You have nausea or vomiting that lasts for more than one day.  You have leg pain. This information is not intended to replace advice given to you by your health care provider. Make sure you discuss any questions you have with your health care provider. Document Revised: 09/06/2017 Document Reviewed: 03/12/2016 Elsevier Patient Education  2020 Else[employer]  General Anesthesia, Adult, Care After This sheet gives you information about how to care for yourself after your procedure. Your health care provider may also give you more specific instructions. If you have problems or questions, contact your health care provider. What can I expect after the procedure? After the procedure, the following side effects are common:  Pain or discomfort at the IV site.  Nausea.  Vomiting.  Sore throat.  Trouble concentrating.  Feeling cold or chills.  Weak or tired.  Sleepiness and fatigue.  Soreness and body aches. These side effects can affect parts of the body that were not involved in surgery. Follow these instructions at home:  For at least 24 hours after the procedure:  Have a responsible adult stay with you. It is important to have someone help care for you until you are awake and alert.  Rest as needed.  Do not: ? Participate in activities in which you could fall or become injured. ? Drive. ? Use heavy  machinery. ? Drink alcohol. ? Take sleeping pills or medicines that cause drowsiness. ? Make important decisions or sign legal documents. ? Take care of children on your own. Eating and drinking  Follow any instructions from your health care provider about eating or drinking restrictions.  When you feel hungry, start by eating small amounts of foods that are soft and easy to digest (bland), such as toast. Gradually return to your regular diet.  Drink enough fluid to keep your urine pale yellow.  If you vomit, rehydrate by drinking water, juice, or clear broth. General instructions  If you have sleep apnea, surgery and certain medicines can increase your risk for breathing problems. Follow instructions from your health care provider about wearing your sleep device: ? Anytime you are sleeping, including during daytime naps. ? While taking prescription  pain medicines, sleeping medicines, or medicines that make you drowsy.  Return to your normal activities as told by your health care provider. Ask your health care provider what activities are safe for you.  Take over-the-counter and prescription medicines only as told by your health care provider.  If you smoke, do not smoke without supervision.  Keep all follow-up visits as told by your health care provider. This is important. Contact a health care provider if:  You have nausea or vomiting that does not get better with medicine.  You cannot eat or drink without vomiting.  You have pain that does not get better with medicine.  You are unable to pass urine.  You develop a skin rash.  You have a fever.  You have redness around your IV site that gets worse. Get help right away if:  You have difficulty breathing.  You have chest pain.  You have blood in your urine or stool, or you vomit blood. Summary  After the procedure, it is common to have a sore throat or nausea. It is also common to feel tired.  Have a responsible  adult stay with you for the first 24 hours after general anesthesia. It is important to have someone help care for you until you are awake and alert.  When you feel hungry, start by eating small amounts of foods that are soft and easy to digest (bland), such as toast. Gradually return to your regular diet.  Drink enough fluid to keep your urine pale yellow.  Return to your normal activities as told by your health care provider. Ask your health care provider what activities are safe for you. This information is not intended to replace advice given to you by your health care provider. Make sure you discuss any questions you have with your health care provider. Document Revised: 09/27/2017 Document Reviewed: 05/10/2017 Elsevier Patient Education  2020 [employer]. How to Use Chlorhexidine for Bathing Chlorhexidine gluconate (CHG) is a germ-killing (antiseptic) solution that is used to clean the skin. It can get rid of the bacteria that normally live on the skin and can keep them away for about 24 hours. To clean your skin with CHG, you may be given:  A CHG solution to use in the shower or as part of a sponge bath.  A prepackaged cloth that contains CHG. Cleaning your skin with CHG may help lower the risk for infection:  While you are staying in the intensive care unit of the hospital.  If you have a vascular access, such as a central line, to provide short-term or long-term access to your veins.  If you have a catheter to drain urine from your bladder.  If you are on a ventilator. A ventilator is a machine that helps you breathe by moving air in and out of your lungs.  After surgery. What are the risks? Risks of using CHG include:  A skin reaction.  Hearing loss, if CHG gets in your ears.  Eye injury, if CHG gets in your eyes and is not rinsed out.  The CHG product catching fire. Make sure that you avoid smoking and flames after applying CHG to your skin. Do not use CHG:  If  you have a chlorhexidine allergy or have previously reacted to chlorhexidine.  On babies younger than 71 months of age. How to use CHG solution  Use CHG only as told by your health care provider, and follow the instructions on the label.  Use the full amount  of CHG as directed. Usually, this is one bottle. During a shower Follow these steps when using CHG solution during a shower (unless your health care provider gives you different instructions): 1. Start the shower. 2. Use your normal soap and shampoo to wash your face and hair. 3. Turn off the shower or move out of the shower stream. 4. Pour the CHG onto a clean washcloth. Do not use any type of brush or rough-edged sponge. 5. Starting at your neck, lather your body down to your toes. Make sure you follow these instructions: ? If you will be having surgery, pay special attention to the part of your body where you will be having surgery. Scrub this area for at least 1 minute. ? Do not use CHG on your head or face. If the solution gets into your ears or eyes, rinse them well with water. ? Avoid your genital area. ? Avoid any areas of skin that have broken skin, cuts, or scrapes. ? Scrub your back and under your arms. Make sure to wash skin folds. 6. Let the lather sit on your skin for 1-2 minutes or as long as told by your health care provider. 7. Thoroughly rinse your entire body in the shower. Make sure that all body creases and crevices are rinsed well. 8. Dry off with a clean towel. Do not put any substances on your body afterward--such as powder, lotion, or perfume--unless you are told to do so by your health care provider. Only use lotions that are recommended by the manufacturer. 9. Put on clean clothes or pajamas. 10. If it is the night before your surgery, sleep in clean sheets.  During a sponge bath Follow these steps when using CHG solution during a sponge bath (unless your health care provider gives you different  instructions): 1. Use your normal soap and shampoo to wash your face and hair. 2. Pour the CHG onto a clean washcloth. 3. Starting at your neck, lather your body down to your toes. Make sure you follow these instructions: ? If you will be having surgery, pay special attention to the part of your body where you will be having surgery. Scrub this area for at least 1 minute. ? Do not use CHG on your head or face. If the solution gets into your ears or eyes, rinse them well with water. ? Avoid your genital area. ? Avoid any areas of skin that have broken skin, cuts, or scrapes. ? Scrub your back and under your arms. Make sure to wash skin folds. 4. Let the lather sit on your skin for 1-2 minutes or as long as told by your health care provider. 5. Using a different clean, wet washcloth, thoroughly rinse your entire body. Make sure that all body creases and crevices are rinsed well. 6. Dry off with a clean towel. Do not put any substances on your body afterward--such as powder, lotion, or perfume--unless you are told to do so by your health care provider. Only use lotions that are recommended by the manufacturer. 7. Put on clean clothes or pajamas. 8. If it is the night before your surgery, sleep in clean sheets. How to use CHG prepackaged cloths  Only use CHG cloths as told by your health care provider, and follow the instructions on the label.  Use the CHG cloth on clean, dry skin.  Do not use the CHG cloth on your head or face unless your health care provider tells you to.  When washing with the CHG  cloth: ? Avoid your genital area. ? Avoid any areas of skin that have broken skin, cuts, or scrapes. Before surgery Follow these steps when using a CHG cloth to clean before surgery (unless your health care provider gives you different instructions): 1. Using the CHG cloth, vigorously scrub the part of your body where you will be having surgery. Scrub using a back-and-forth motion for 3 minutes.  The area on your body should be completely wet with CHG when you are done scrubbing. 2. Do not rinse. Discard the cloth and let the area air-dry. Do not put any substances on the area afterward, such as powder, lotion, or perfume. 3. Put on clean clothes or pajamas. 4. If it is the night before your surgery, sleep in clean sheets.  For general bathing Follow these steps when using CHG cloths for general bathing (unless your health care provider gives you different instructions). 1. Use a separate CHG cloth for each area of your body. Make sure you wash between any folds of skin and between your fingers and toes. Wash your body in the following order, switching to a new cloth after each step: ? The front of your neck, shoulders, and chest. ? Both of your arms, under your arms, and your hands. ? Your stomach and groin area, avoiding the genitals. ? Your right leg and foot. ? Your left leg and foot. ? The back of your neck, your back, and your buttocks. 2. Do not rinse. Discard the cloth and let the area air-dry. Do not put any substances on your body afterward--such as powder, lotion, or perfume--unless you are told to do so by your health care provider. Only use lotions that are recommended by the manufacturer. 3. Put on clean clothes or pajamas. Contact a health care provider if:  Your skin gets irritated after scrubbing.  You have questions about using your solution or cloth. Get help right away if:  Your eyes become very red or swollen.  Your eyes itch badly.  Your skin itches badly and is red or swollen.  Your hearing changes.  You have trouble seeing.  You have swelling or tingling in your mouth or throat.  You have trouble breathing.  You swallow any chlorhexidine. Summary  Chlorhexidine gluconate (CHG) is a germ-killing (antiseptic) solution that is used to clean the skin. Cleaning your skin with CHG may help to lower your risk for infection.  You may be given CHG to  use for bathing. It may be in a bottle or in a prepackaged cloth to use on your skin. Carefully follow your health care provider's instructions and the instructions on the product label.  Do not use CHG if you have a chlorhexidine allergy.  Contact your health care provider if your skin gets irritated after scrubbing. This information is not intended to replace advice given to you by your health care provider. Make sure you discuss any questions you have with your health care provider. Document Revised: 12/11/2018 Document Reviewed: 08/22/2017 Elsevier Patient Education  2020 [employer].

## 2020-04-25 ENCOUNTER — Encounter (HOSPITAL_COMMUNITY)
Admission: RE | Admit: 2020-04-25 | Discharge: 2020-04-25 | Disposition: A | Discharge status: Home / Self Care | Payer: 59 | Source: Ambulatory Visit | Attending: General Surgery | Admitting: General Surgery

## 2020-04-25 ENCOUNTER — Other Ambulatory Visit: Payer: Self-pay

## 2020-04-25 ENCOUNTER — Other Ambulatory Visit (HOSPITAL_COMMUNITY)
Admission: RE | Admit: 2020-04-25 | Discharge: 2020-04-25 | Disposition: A | Discharge status: Home / Self Care | Payer: 59 | Source: Ambulatory Visit | Attending: General Surgery | Admitting: General Surgery

## 2020-04-25 DIAGNOSIS — Z20822 Contact with and (suspected) exposure to covid-19: Secondary | ICD-10-CM | POA: Insufficient documentation

## 2020-04-25 DIAGNOSIS — Z01812 Encounter for preprocedural laboratory examination: Secondary | ICD-10-CM | POA: Diagnosis present

## 2020-04-25 LAB — HCG, SERUM, QUALITATIVE: Preg, Serum: NEGATIVE

## 2020-04-26 LAB — SARS CORONAVIRUS 2 (TAT 6-24 HRS): SARS Coronavirus 2: NEGATIVE

## 2020-04-27 ENCOUNTER — Encounter (HOSPITAL_COMMUNITY)
Admission: RE | Disposition: A | Discharge status: Home / Self Care | Payer: Self-pay | Source: Home / Self Care | Attending: General Surgery

## 2020-04-27 ENCOUNTER — Encounter (HOSPITAL_COMMUNITY): Payer: Self-pay | Admitting: General Surgery

## 2020-04-27 ENCOUNTER — Ambulatory Visit (HOSPITAL_COMMUNITY): Payer: 59 | Admitting: Anesthesiology

## 2020-04-27 ENCOUNTER — Ambulatory Visit (HOSPITAL_COMMUNITY)
Admission: RE | Admit: 2020-04-27 | Discharge: 2020-04-27 | Disposition: A | Discharge status: Home / Self Care | Payer: 59 | Attending: General Surgery | Admitting: General Surgery

## 2020-04-27 DIAGNOSIS — K811 Chronic cholecystitis: Secondary | ICD-10-CM | POA: Diagnosis not present

## 2020-04-27 HISTORY — PX: CHOLECYSTECTOMY: SHX55

## 2020-04-27 SURGERY — LAPAROSCOPIC CHOLECYSTECTOMY
Anesthesia: General | Site: Abdomen

## 2020-04-27 MED ORDER — ONDANSETRON HCL 4 MG/2ML IJ SOLN
INTRAMUSCULAR | Status: AC
  Filled 2020-04-27: qty 2

## 2020-04-27 MED ORDER — CIPROFLOXACIN IN D5W 400 MG/200ML IV SOLN
INTRAVENOUS | Status: AC
  Filled 2020-04-27: qty 200

## 2020-04-27 MED ORDER — ONDANSETRON HCL 4 MG/2ML IJ SOLN
INTRAMUSCULAR | Status: DC | PRN
  Administered 2020-04-27: 4 mg via INTRAVENOUS

## 2020-04-27 MED ORDER — CIPROFLOXACIN IN D5W 400 MG/200ML IV SOLN
400.0000 mg | INTRAVENOUS | Status: AC
  Administered 2020-04-27: 400 mg via INTRAVENOUS

## 2020-04-27 MED ORDER — HYDROMORPHONE HCL 1 MG/ML IJ SOLN: 0.2500 mg | INTRAMUSCULAR | Status: DC | PRN

## 2020-04-27 MED ORDER — GLYCOPYRROLATE 0.2 MG/ML IJ SOLN
INTRAMUSCULAR | Status: DC | PRN
  Administered 2020-04-27: .2 mg via INTRAVENOUS

## 2020-04-27 MED ORDER — SCOPOLAMINE 1 MG/3DAYS TD PT72
MEDICATED_PATCH | TRANSDERMAL | Status: AC
  Filled 2020-04-27: qty 1

## 2020-04-27 MED ORDER — CHLORHEXIDINE GLUCONATE 0.12 % MT SOLN
15.0000 mL | Freq: Once | OROMUCOSAL | Status: AC
  Administered 2020-04-27: 15 mL via OROMUCOSAL

## 2020-04-27 MED ORDER — MIDAZOLAM HCL 2 MG/2ML IJ SOLN
INTRAMUSCULAR | Status: DC | PRN
  Administered 2020-04-27: 2 mg via INTRAVENOUS

## 2020-04-27 MED ORDER — CHLORHEXIDINE GLUCONATE CLOTH 2 % EX PADS: 6.0000 | MEDICATED_PAD | Freq: Once | CUTANEOUS | Status: DC

## 2020-04-27 MED ORDER — ONDANSETRON HCL 4 MG PO TABS: 4.0000 mg | ORAL_TABLET | Freq: Three times a day (TID) | ORAL | 0 refills | Status: AC | PRN

## 2020-04-27 MED ORDER — FENTANYL CITRATE (PF) 250 MCG/5ML IJ SOLN
INTRAMUSCULAR | Status: AC
  Filled 2020-04-27: qty 5

## 2020-04-27 MED ORDER — MIDAZOLAM HCL 2 MG/2ML IJ SOLN
INTRAMUSCULAR | Status: AC
  Filled 2020-04-27: qty 2

## 2020-04-27 MED ORDER — LIDOCAINE 2% (20 MG/ML) 5 ML SYRINGE
INTRAMUSCULAR | Status: AC
  Filled 2020-04-27: qty 5

## 2020-04-27 MED ORDER — PROPOFOL 10 MG/ML IV BOLUS
INTRAVENOUS | Status: DC | PRN
  Administered 2020-04-27: 200 mg via INTRAVENOUS

## 2020-04-27 MED ORDER — ROCURONIUM BROMIDE 100 MG/10ML IV SOLN
INTRAVENOUS | Status: DC | PRN
  Administered 2020-04-27: 40 mg via INTRAVENOUS

## 2020-04-27 MED ORDER — KETOROLAC TROMETHAMINE 30 MG/ML IJ SOLN
30.0000 mg | Freq: Once | INTRAMUSCULAR | Status: AC
  Administered 2020-04-27: 30 mg via INTRAVENOUS
  Filled 2020-04-27: qty 1

## 2020-04-27 MED ORDER — DEXAMETHASONE SODIUM PHOSPHATE 10 MG/ML IJ SOLN
INTRAMUSCULAR | Status: AC
  Filled 2020-04-27: qty 1

## 2020-04-27 MED ORDER — GLYCOPYRROLATE PF 0.2 MG/ML IJ SOSY
PREFILLED_SYRINGE | INTRAMUSCULAR | Status: AC
  Filled 2020-04-27: qty 1

## 2020-04-27 MED ORDER — SODIUM CHLORIDE 0.9 % IR SOLN
Status: DC | PRN
  Administered 2020-04-27: 1000 mL

## 2020-04-27 MED ORDER — HEMOSTATIC AGENTS (NO CHARGE) OPTIME
TOPICAL | Status: DC | PRN
  Administered 2020-04-27: 1 via TOPICAL

## 2020-04-27 MED ORDER — ORAL CARE MOUTH RINSE: 15.0000 mL | Freq: Once | OROMUCOSAL | Status: AC

## 2020-04-27 MED ORDER — EPHEDRINE 5 MG/ML INJ
INTRAVENOUS | Status: AC
  Filled 2020-04-27: qty 10

## 2020-04-27 MED ORDER — MEPERIDINE HCL 50 MG/ML IJ SOLN: 6.2500 mg | INTRAMUSCULAR | Status: DC | PRN

## 2020-04-27 MED ORDER — ROCURONIUM BROMIDE 10 MG/ML (PF) SYRINGE
PREFILLED_SYRINGE | INTRAVENOUS | Status: AC
  Filled 2020-04-27: qty 10

## 2020-04-27 MED ORDER — SCOPOLAMINE 1 MG/3DAYS TD PT72
1.0000 | MEDICATED_PATCH | Freq: Once | TRANSDERMAL | Status: AC
  Administered 2020-04-27: 1 via TRANSDERMAL

## 2020-04-27 MED ORDER — FENTANYL CITRATE (PF) 100 MCG/2ML IJ SOLN
INTRAMUSCULAR | Status: DC | PRN
  Administered 2020-04-27: 50 ug via INTRAVENOUS
  Administered 2020-04-27: 100 ug via INTRAVENOUS
  Administered 2020-04-27 (×2): 50 ug via INTRAVENOUS

## 2020-04-27 MED ORDER — BUPIVACAINE LIPOSOME 1.3 % IJ SUSP
INTRAMUSCULAR | Status: AC
  Filled 2020-04-27: qty 20

## 2020-04-27 MED ORDER — HYDROCODONE-ACETAMINOPHEN 5-325 MG PO TABS: 1.0000 | ORAL_TABLET | Freq: Four times a day (QID) | ORAL | 0 refills | Status: AC | PRN

## 2020-04-27 MED ORDER — PROMETHAZINE HCL 25 MG/ML IJ SOLN: 6.2500 mg | INTRAMUSCULAR | Status: DC | PRN

## 2020-04-27 MED ORDER — SUCCINYLCHOLINE CHLORIDE 200 MG/10ML IV SOSY
PREFILLED_SYRINGE | INTRAVENOUS | Status: AC
  Filled 2020-04-27: qty 10

## 2020-04-27 MED ORDER — PROPOFOL 10 MG/ML IV BOLUS
INTRAVENOUS | Status: AC
  Filled 2020-04-27: qty 20

## 2020-04-27 MED ORDER — LACTATED RINGERS IV SOLN
Freq: Once | INTRAVENOUS | Status: AC
  Administered 2020-04-27: 1000 mL via INTRAVENOUS

## 2020-04-27 MED ORDER — LIDOCAINE HCL (CARDIAC) PF 50 MG/5ML IV SOSY
PREFILLED_SYRINGE | INTRAVENOUS | Status: DC | PRN
  Administered 2020-04-27: 100 mg via INTRAVENOUS

## 2020-04-27 MED ORDER — LACTATED RINGERS IV SOLN
INTRAVENOUS | Status: DC | PRN
Start: 2020-04-27 — End: 2020-04-27

## 2020-04-27 MED ORDER — EPHEDRINE SULFATE 50 MG/ML IJ SOLN
INTRAMUSCULAR | Status: DC | PRN
  Administered 2020-04-27: 10 mg via INTRAVENOUS

## 2020-04-27 MED ORDER — SUCCINYLCHOLINE CHLORIDE 20 MG/ML IJ SOLN
INTRAMUSCULAR | Status: DC | PRN
  Administered 2020-04-27: 140 mg via INTRAVENOUS

## 2020-04-27 MED ORDER — BUPIVACAINE LIPOSOME 1.3 % IJ SUSP
INTRAMUSCULAR | Status: DC | PRN
  Administered 2020-04-27: 20 mL

## 2020-04-27 MED ORDER — SUGAMMADEX SODIUM 500 MG/5ML IV SOLN
INTRAVENOUS | Status: DC | PRN
  Administered 2020-04-27: 200 mg via INTRAVENOUS

## 2020-04-27 MED ORDER — DEXAMETHASONE SODIUM PHOSPHATE 10 MG/ML IJ SOLN
INTRAMUSCULAR | Status: DC | PRN
  Administered 2020-04-27: 4 mg via INTRAVENOUS

## 2020-04-27 SURGICAL SUPPLY — 52 items
ADH SKN CLS APL DERMABOND .7 (GAUZE/BANDAGES/DRESSINGS) ×1
APL PRP STRL LF DISP 70% ISPRP (MISCELLANEOUS) ×1
APL SRG 38 LTWT LNG FL B (MISCELLANEOUS)
APPLICATOR ARISTA FLEXITIP XL (MISCELLANEOUS) IMPLANT
APPLIER CLIP ROT 10 11.4 M/L (STAPLE) ×3
APR CLP MED LRG 11.4X10 (STAPLE) ×1
BAG RETRIEVAL 10 (BASKET) ×1
BAG RETRIEVAL 10MM (BASKET) ×1
CHLORAPREP W/TINT 26 (MISCELLANEOUS) ×3 IMPLANT
CLIP APPLIE ROT 10 11.4 M/L (STAPLE) ×1 IMPLANT
CLOTH BEACON ORANGE TIMEOUT ST (SAFETY) ×3 IMPLANT
COVER LIGHT HANDLE STERIS (MISCELLANEOUS) ×6 IMPLANT
COVER WAND RF STERILE (DRAPES) ×3 IMPLANT
DERMABOND ADVANCED (GAUZE/BANDAGES/DRESSINGS) ×2
DERMABOND ADVANCED .7 DNX12 (GAUZE/BANDAGES/DRESSINGS) ×1 IMPLANT
ELECT REM PT RETURN 9FT ADLT (ELECTROSURGICAL) ×3
ELECTRODE REM PT RTRN 9FT ADLT (ELECTROSURGICAL) ×1 IMPLANT
GLOVE BIOGEL M 7.0 STRL (GLOVE) ×2 IMPLANT
GLOVE BIOGEL M STRL SZ7.5 (GLOVE) ×2 IMPLANT
GLOVE BIOGEL PI IND STRL 7.0 (GLOVE) ×2 IMPLANT
GLOVE BIOGEL PI IND STRL 7.5 (GLOVE) IMPLANT
GLOVE BIOGEL PI INDICATOR 7.0 (GLOVE) ×6
GLOVE BIOGEL PI INDICATOR 7.5 (GLOVE) ×2
GLOVE SURG SS PI 7.5 STRL IVOR (GLOVE) ×3 IMPLANT
GOWN STRL REUS W/TWL LRG LVL3 (GOWN DISPOSABLE) ×9 IMPLANT
HEMOSTAT ARISTA ABSORB 3G PWDR (HEMOSTASIS) IMPLANT
HEMOSTAT SNOW SURGICEL 2X4 (HEMOSTASIS) ×3 IMPLANT
INST SET LAPROSCOPIC AP (KITS) ×3 IMPLANT
KIT TURNOVER KIT A (KITS) ×3 IMPLANT
MANIFOLD NEPTUNE II (INSTRUMENTS) ×3 IMPLANT
NDL HYPO 18GX1.5 BLUNT FILL (NEEDLE) ×1 IMPLANT
NDL INSUFFLATION 14GA 120MM (NEEDLE) ×1 IMPLANT
NEEDLE HYPO 18GX1.5 BLUNT FILL (NEEDLE) ×3 IMPLANT
NEEDLE HYPO 22GX1.5 SAFETY (NEEDLE) ×3 IMPLANT
NEEDLE INSUFFLATION 14GA 120MM (NEEDLE) ×3 IMPLANT
NS IRRIG 1000ML POUR BTL (IV SOLUTION) ×3 IMPLANT
PACK LAP CHOLE LZT030E (CUSTOM PROCEDURE TRAY) ×3 IMPLANT
PAD ARMBOARD 7.5X6 YLW CONV (MISCELLANEOUS) ×3 IMPLANT
SET BASIN LINEN APH (SET/KITS/TRAYS/PACK) ×3 IMPLANT
SET TUBE SMOKE EVAC HIGH FLOW (TUBING) ×3 IMPLANT
SLEEVE ENDOPATH XCEL 5M (ENDOMECHANICALS) ×3 IMPLANT
SUT MNCRL AB 4-0 PS2 18 (SUTURE) ×6 IMPLANT
SUT VICRYL 0 UR6 27IN ABS (SUTURE) ×3 IMPLANT
SYR 20ML LL LF (SYRINGE) ×6 IMPLANT
SYS BAG RETRIEVAL 10MM (BASKET) ×1
SYSTEM BAG RETRIEVAL 10MM (BASKET) ×1 IMPLANT
TROCAR ENDO BLADELESS 11MM (ENDOMECHANICALS) ×3 IMPLANT
TROCAR XCEL NON-BLD 5MMX100MML (ENDOMECHANICALS) ×3 IMPLANT
TROCAR XCEL UNIV SLVE 11M 100M (ENDOMECHANICALS) ×3 IMPLANT
TUBE CONNECTING 12'X1/4 (SUCTIONS) ×1
TUBE CONNECTING 12X1/4 (SUCTIONS) ×2 IMPLANT
WARMER LAPAROSCOPE (MISCELLANEOUS) ×3 IMPLANT

## 2020-04-27 NOTE — Anesthesia Preprocedure Evaluation (Signed)
Anesthesia Evaluation  Patient identified by MRN, date of birth, ID band Patient awake    Reviewed: Allergy & Precautions, NPO status , Patient's Chart, lab work & pertinent test results  History of Anesthesia Complications Negative for: history of anesthetic complications  Airway Mallampati: II  TM Distance: >3 FB Neck ROM: Full    Dental  (+) Dental Advisory Given, Missing   Pulmonary neg pulmonary ROS,    Pulmonary exam normal breath sounds clear to auscultation       Cardiovascular Exercise Tolerance: Good + dysrhythmias (Occasional PVCs )  Rhythm:Regular Rate:Normal - Systolic murmurs, - Diastolic murmurs, - Friction Rub, - Carotid Bruit, - Peripheral Edema and - Systolic Click Occasional PVCs    Neuro/Psych negative neurological ROS  negative psych ROS   GI/Hepatic (+)     substance abuse  alcohol use, Cholecystitis    Endo/Other  negative endocrine ROS  Renal/GU negative Renal ROS  negative genitourinary   Musculoskeletal negative musculoskeletal ROS (+)   Abdominal   Peds negative pediatric ROS (+)  Hematology negative hematology ROS (+)   Anesthesia Other Findings   Reproductive/Obstetrics negative OB ROS                            Anesthesia Physical Anesthesia Plan  ASA: II  Anesthesia Plan: General   Post-op Pain Management:    Induction: Intravenous  PONV Risk Score and Plan: 4 or greater and Midazolam, Dexamethasone, Ondansetron and Scopolamine patch - Pre-op  Airway Management Planned: Oral ETT  Additional Equipment:   Intra-op Plan:   Post-operative Plan: Extubation in OR  Informed Consent: I have reviewed the patients History and Physical, chart, labs and discussed the procedure including the risks, benefits and alternatives for the proposed anesthesia with the patient or authorized representative who has indicated his/her understanding and acceptance.      Dental advisory given  Plan Discussed with: CRNA and Surgeon  Anesthesia Plan Comments:        Anesthesia Quick Evaluation

## 2020-04-27 NOTE — Interval H&P Note (Signed)
History and Physical Interval Note:  04/27/2020 7:12 AM  Kaitlyn Watkins  has presented today for surgery, with the diagnosis of Chronic cholecystitis.  The various methods of treatment have been discussed with the patient and family. After consideration of risks, benefits and other options for treatment, the patient has consented to  Procedure(s): LAPAROSCOPIC CHOLECYSTECTOMY (N/A) as a surgical intervention.  The patient's history has been reviewed, patient examined, no change in status, stable for surgery.  I have reviewed the patient's chart and labs.  Questions were answered to the patient's satisfaction.     Kaitlyn Watkins

## 2020-04-27 NOTE — Transfer of Care (Signed)
Immediate Anesthesia Transfer of Care Note  Patient: Kaitlyn Watkins  Procedure(s) Performed: LAPAROSCOPIC CHOLECYSTECTOMY (N/A )  Patient Location: PACU  Anesthesia Type:General  Level of Consciousness: awake, alert , oriented and patient cooperative  Airway & Oxygen Therapy: Patient Spontanous Breathing and Patient connected to nasal cannula oxygen  Post-op Assessment: Report given to RN, Post -op Vital signs reviewed and stable and Patient moving all extremities  Post vital signs: Reviewed and stable  Last Vitals:  Vitals Value Taken Time  BP 134/93 04/27/20 0809  Temp    Pulse 50 04/27/20 0811  Resp 19 04/27/20 0811  SpO2 96 % 04/27/20 0811  Vitals shown include unvalidated device data.  Last Pain:  Vitals:   04/27/20 0646  PainSc: 0-No pain      Patients Stated Pain Goal: 9 (04/27/20 0646)  Complications: No complications documented.

## 2020-04-27 NOTE — Anesthesia Postprocedure Evaluation (Signed)
Anesthesia Post Note  Patient: Kaitlyn Watkins  Procedure(s) Performed: LAPAROSCOPIC CHOLECYSTECTOMY (N/A )  Patient location during evaluation: PACU Anesthesia Type: General Level of consciousness: awake, oriented, awake and alert and patient cooperative Pain management: pain level controlled Vital Signs Assessment: post-procedure vital signs reviewed and stable Respiratory status: spontaneous breathing, respiratory function stable, nonlabored ventilation and patient connected to nasal cannula oxygen Cardiovascular status: blood pressure returned to baseline and stable Postop Assessment: no headache and no backache Anesthetic complications: no   No complications documented.   Last Vitals:  Vitals:   04/27/20 0646  BP: 102/75  Resp: 20  Temp: 36.6 C  SpO2: 99%    Last Pain:  Vitals:   04/27/20 0646  PainSc: 0-No pain                 Brynda Peon

## 2020-04-27 NOTE — Anesthesia Procedure Notes (Signed)
Procedure Name: Intubation °Performed by: Abrielle Finck L, CRNA °Pre-anesthesia Checklist: Patient identified, Emergency Drugs available, Suction available, Patient being monitored and Timeout performed °Patient Re-evaluated:Patient Re-evaluated prior to induction °Oxygen Delivery Method: Circle system utilized °Preoxygenation: Pre-oxygenation with 100% oxygen °Induction Type: IV induction °Laryngoscope Size: Miller and 2 °Grade View: Grade I °Tube size: 7.0 mm °Number of attempts: 1 °Airway Equipment and Method: Stylet °Placement Confirmation: ETT inserted through vocal cords under direct vision,  positive ETCO2,  CO2 detector and breath sounds checked- equal and bilateral °Secured at: 21 cm °Tube secured with: Tape °Dental Injury: Teeth and Oropharynx as per pre-operative assessment  ° ° ° ° ° ° °

## 2020-04-27 NOTE — Op Note (Signed)
Patient:  Kaitlyn Watkins  DOB:  [birth year]  MRN:  [id number]   Preop Diagnosis: Chronic cholecystitis  Postop Diagnosis: Same  Procedure: Laparoscopic cholecystectomy  Surgeon: Franky Macho, MD  Anes: General endotracheal  Indications: Patient is a 44 year old white female who presents with biliary colic secondary to chronic cholecystitis.  The risks and benefits of the procedure including bleeding, infection, hepatobiliary injury, and the possibility of an open procedure were fully explained to the patient, who gave informed consent.  Procedure note: The patient was placed in the supine position.  After induction of general endotracheal anesthesia, the abdomen was prepped and draped using the usual sterile technique with ChloraPrep.  Surgical site confirmation was performed.  A supraumbilical incision was made down to the fascia.  A Veress needle was introduced into the abdominal cavity and confirmation of placement was done using the saline drop test.  The abdomen was then insufflated to 15 mmHg pressure.  An 11 mm trocar was introduced into the abdominal cavity under direct visualization without difficulty.  The patient was placed in reverse Trendelenburg position and an additional 11 mm trocar was placed in the epigastric region and 5 mm trochars were placed in the right upper quadrant and right flank regions.  The liver was inspected and noted to be within normal limits.  The gallbladder was retracted in a dynamic fashion in order to provide a critical view of the triangle of Calot.  The cystic duct was first identified.  Its juncture to the infundibulum was fully identified.  Endoclips were placed proximally and distally on the cystic duct, and the cystic duct was divided.  This was likewise done of the cystic artery.  The gallbladder was freed away from the gallbladder fossa using Bovie electrocautery.  The gallbladder was delivered through the epigastric trocar site using an Endo  Catch bag.  The gallbladder fossa was inspected and no abnormal bleeding or bile leakage was noted.  Surgicel was placed in the gallbladder fossa.  All fluid and air were then evacuated from the abdominal cavity prior to removal of the trochars.  All wounds were irrigated with normal saline.  All wounds were injected with Exparel.  The skin incisions were closed using 4-0 Monocryl subcuticular suture.  Dermabond was applied.  All tape and needle counts were correct at the end of the procedure.  The patient was extubated in the operating room and transferred to PACU in stable condition.  Complications: None  EBL: Minimal  Specimen: Gallbladder

## 2020-04-27 NOTE — Discharge Instructions (Signed)
Laparoscopic Cholecystectomy, Care After This sheet gives you information about how to care for yourself after your procedure. Your health care provider may also give you more specific instructions. If you have problems or questions, contact your health care provider. What can I expect after the procedure? After the procedure, it is common to have:  Pain at your incision sites. You will be given medicines to control this pain.  Mild nausea or vomiting.  Bloating and possible shoulder pain from the air-like gas that was used during the procedure. Follow these instructions at home: Incision care   Follow instructions from your health care provider about how to take care of your incisions. Make sure you: ? Wash your hands with soap and water before you change your bandage (dressing). If soap and water are not available, use hand sanitizer. ? Change your dressing as told by your health care provider. ? Leave stitches (sutures), skin glue, or adhesive strips in place. These skin closures may need to be in place for 2 weeks or longer. If adhesive strip edges start to loosen and curl up, you may trim the loose edges. Do not remove adhesive strips completely unless your health care provider tells you to do that.  Do not take baths, swim, or use a hot tub until your health care provider approves. Ask your health care provider if you can take showers. You may only be allowed to take sponge baths for bathing.  Check your incision area every day for signs of infection. Check for: ? More redness, swelling, or pain. ? More fluid or blood. ? Warmth. ? Pus or a bad smell. Activity  Do not drive or use heavy machinery while taking prescription pain medicine.  Do not lift anything that is heavier than 10 lb (4.5 kg) until your health care provider approves.  Do not play contact sports until your health care provider approves.  Do not drive for 24 hours if you were given a medicine to help you relax  (sedative).  Rest as needed. Do not return to work or school until your health care provider approves. General instructions  Take over-the-counter and prescription medicines only as told by your health care provider.  To prevent or treat constipation while you are taking prescription pain medicine, your health care provider may recommend that you: ? Drink enough fluid to keep your urine clear or pale yellow. ? Take over-the-counter or prescription medicines. ? Eat foods that are high in fiber, such as fresh fruits and vegetables, whole grains, and beans. ? Limit foods that are high in fat and processed sugars, such as fried and sweet foods. Contact a health care provider if:  You develop a rash.  You have more redness, swelling, or pain around your incisions.  You have more fluid or blood coming from your incisions.  Your incisions feel warm to the touch.  You have pus or a bad smell coming from your incisions.  You have a fever.  One or more of your incisions breaks open. Get help right away if:  You have trouble breathing.  You have chest pain.  You have increasing pain in your shoulders.  You faint or feel dizzy when you stand.  You have severe pain in your abdomen.  You have nausea or vomiting that lasts for more than one day.  You have leg pain. This information is not intended to replace advice given to you by your health care provider. Make sure you discuss any questions you have   with your health care provider. Document Revised: 09/06/2017 Document Reviewed: 03/12/2016 Elsevier Patient Education  2020 [employer]   General Anesthesia, Adult, Care After This sheet gives you information about how to care for yourself after your procedure. Your health care provider may also give you more specific instructions. If you have problems or questions, contact your health care provider. What can I expect after the procedure? After the procedure, the following side  effects are common:  Pain or discomfort at the IV site.  Nausea.  Vomiting.  Sore throat.  Trouble concentrating.  Feeling cold or chills.  Weak or tired.  Sleepiness and fatigue.  Soreness and body aches. These side effects can affect parts of the body that were not involved in surgery. Follow these instructions at home:  For at least 24 hours after the procedure:  Have a responsible adult stay with you. It is important to have someone help care for you until you are awake and alert.  Rest as needed.  Do not: ? Participate in activities in which you could fall or become injured. ? Drive. ? Use heavy machinery. ? Drink alcohol. ? Take sleeping pills or medicines that cause drowsiness. ? Make important decisions or sign legal documents. ? Take care of children on your own. Eating and drinking  Follow any instructions from your health care provider about eating or drinking restrictions.  When you feel hungry, start by eating small amounts of foods that are soft and easy to digest (bland), such as toast. Gradually return to your regular diet.  Drink enough fluid to keep your urine pale yellow.  If you vomit, rehydrate by drinking water, juice, or clear broth. General instructions  If you have sleep apnea, surgery and certain medicines can increase your risk for breathing problems. Follow instructions from your health care provider about wearing your sleep device: ? Anytime you are sleeping, including during daytime naps. ? While taking prescription pain medicines, sleeping medicines, or medicines that make you drowsy.  Return to your normal activities as told by your health care provider. Ask your health care provider what activities are safe for you.  Take over-the-counter and prescription medicines only as told by your health care provider.  If you smoke, do not smoke without supervision.  Keep all follow-up visits as told by your health care provider. This is  important. Contact a health care provider if:  You have nausea or vomiting that does not get better with medicine.  You cannot eat or drink without vomiting.  You have pain that does not get better with medicine.  You are unable to pass urine.  You develop a skin rash.  You have a fever.  You have redness around your IV site that gets worse. Get help right away if:  You have difficulty breathing.  You have chest pain.  You have blood in your urine or stool, or you vomit blood. Summary  After the procedure, it is common to have a sore throat or nausea. It is also common to feel tired.  Have a responsible adult stay with you for the first 24 hours after general anesthesia. It is important to have someone help care for you until you are awake and alert.  When you feel hungry, start by eating small amounts of foods that are soft and easy to digest (bland), such as toast. Gradually return to your regular diet.  Drink enough fluid to keep your urine pale yellow.  Return to your normal activities as   told by your health care provider. Ask your health care provider what activities are safe for you. This information is not intended to replace advice given to you by your health care provider. Make sure you discuss any questions you have with your health care provider. Document Revised: 09/27/2017 Document Reviewed: 05/10/2017 Elsevier Patient Education  2020 Elsevier Inc.  

## 2020-04-28 ENCOUNTER — Encounter (HOSPITAL_COMMUNITY): Payer: Self-pay | Admitting: General Surgery

## 2020-04-28 LAB — SURGICAL PATHOLOGY

## 2020-05-06 ENCOUNTER — Encounter: Payer: Self-pay | Admitting: Family Medicine

## 2020-05-06 ENCOUNTER — Telehealth (INDEPENDENT_AMBULATORY_CARE_PROVIDER_SITE_OTHER): Payer: Self-pay | Admitting: General Surgery

## 2020-05-06 DIAGNOSIS — Z09 Encounter for follow-up examination after completed treatment for conditions other than malignant neoplasm: Secondary | ICD-10-CM

## 2020-05-06 NOTE — Progress Notes (Signed)
Subjective:     Kaitlyn Watkins  Patient called office for postoperative visit.  States she is doing well.  Her incisions are healing well.  She is returning to normal activity. Objective:    There were no vitals taken for this visit.  General:  alert and cooperative  Final pathology consistent with diagnosis.     Assessment:    Doing well postoperatively.    Plan:   Increase activity as able.  Follow-up with me as needed.  Total telehealth time 5 minutes.

## 2022-07-05 ENCOUNTER — Other Ambulatory Visit (HOSPITAL_COMMUNITY): Payer: Self-pay | Admitting: Family Medicine

## 2022-07-05 DIAGNOSIS — Z1231 Encounter for screening mammogram for malignant neoplasm of breast: Secondary | ICD-10-CM

## 2022-07-09 ENCOUNTER — Ambulatory Visit (HOSPITAL_COMMUNITY)
Admission: RE | Admit: 2022-07-09 | Discharge: 2022-07-09 | Disposition: A | Discharge status: Home / Self Care | Payer: BC Managed Care – PPO | Source: Ambulatory Visit | Attending: Family Medicine | Admitting: Family Medicine

## 2022-07-09 DIAGNOSIS — Z1231 Encounter for screening mammogram for malignant neoplasm of breast: Secondary | ICD-10-CM | POA: Diagnosis not present

## 2023-06-05 ENCOUNTER — Emergency Department (HOSPITAL_COMMUNITY)
Admission: EM | Admit: 2023-06-05 | Discharge: 2023-06-05 | Disposition: A | Discharge status: Home / Self Care | Payer: BLUE CROSS/BLUE SHIELD | Attending: Emergency Medicine | Admitting: Emergency Medicine

## 2023-06-05 ENCOUNTER — Emergency Department (HOSPITAL_COMMUNITY): Payer: BLUE CROSS/BLUE SHIELD

## 2023-06-05 ENCOUNTER — Encounter (HOSPITAL_COMMUNITY): Payer: Self-pay

## 2023-06-05 ENCOUNTER — Other Ambulatory Visit: Payer: Self-pay

## 2023-06-05 DIAGNOSIS — R079 Chest pain, unspecified: Secondary | ICD-10-CM

## 2023-06-05 DIAGNOSIS — R0789 Other chest pain: Secondary | ICD-10-CM | POA: Diagnosis present

## 2023-06-05 DIAGNOSIS — Z79899 Other long term (current) drug therapy: Secondary | ICD-10-CM | POA: Diagnosis not present

## 2023-06-05 DIAGNOSIS — I10 Essential (primary) hypertension: Secondary | ICD-10-CM | POA: Diagnosis not present

## 2023-06-05 LAB — BASIC METABOLIC PANEL
Anion gap: 7 (ref 5–15)
BUN: 14 mg/dL (ref 6–20)
CO2: 25 mmol/L (ref 22–32)
Calcium: 9.1 mg/dL (ref 8.9–10.3)
Chloride: 105 mmol/L (ref 98–111)
Creatinine, Ser: 0.65 mg/dL (ref 0.44–1.00)
GFR, Estimated: >60 mL/min (ref >60)
Glucose, Bld: 83 mg/dL (ref 70–99)
Potassium: 4.1 mmol/L (ref 3.5–5.1)
Sodium: 137 mmol/L (ref 135–145)

## 2023-06-05 LAB — CBC
HCT: 39.6 % (ref 36.0–46.0)
Hemoglobin: 12.8 g/dL (ref 12.0–15.0)
MCH: 28.4 pg (ref 26.0–34.0)
MCHC: 32.3 g/dL (ref 30.0–36.0)
MCV: 87.8 fL (ref 80.0–100.0)
Platelets: 297 10*3/uL (ref 150–400)
RBC: 4.51 MIL/uL (ref 3.87–5.11)
RDW: 13.2 % (ref 11.5–15.5)
WBC: 8.6 10*3/uL (ref 4.0–10.5)
nRBC: 0 % (ref 0.0–0.2)

## 2023-06-05 LAB — TROPONIN I (HIGH SENSITIVITY)
Troponin I (High Sensitivity): <2 ng/L (ref <18)
Troponin I (High Sensitivity): <2 ng/L (ref <18)

## 2023-06-05 NOTE — ED Triage Notes (Signed)
Pt complains of chest pain that started last night while laying in bed. Pt states initially pain went into left shoulder, left jaw and behind left shoulder blade. Pt states pain lasted approx 25 mins and then after taking a few deep breaths was able to relax and go to sleep. This morning felt fine. Pt states this after noon chest pain returned and  radiated from left elbow to left wrist. Pt was advised by PCP to come to ER to be evaluated. Pt states at this time discomfort feels like a muscle ache above left breast.

## 2023-06-06 NOTE — ED Provider Notes (Signed)
[facility] AT [facility] Provider Note   CSN: [id number] Arrival date & time: 06/05/23  1458     History  Chief Complaint  Patient presents with   Chest Pain    Kaitlyn Watkins is a 47 y.o. female.   Chest Pain Patient presents with left-sided chest pain.  Had episode last night and then again today.  Last around 20 minutes started in the left chest and went to back also went up to jaw and arm.  Had another episode today that was more in the chest but also left elbow to wrist.  Feeling better now.  Has done some exertion without pain.  Is not had pain like this before.  No fevers or chills.  No cough.  No known cardiac history.    History reviewed. No pertinent past medical history.  Home Medications Prior to Admission medications   Medication Sig Start Date End Date Taking? Authorizing Provider  diphenhydrAMINE (BENADRYL) 25 MG tablet Take 25 mg by mouth daily.    [provider]  HYDROcodone-acetaminophen (NORCO) 5-325 MG tablet Take 1 tablet by mouth every 6 (six) hours as needed for moderate pain. 04/27/20   Franky Macho, MD  ondansetron (ZOFRAN) 4 MG tablet Take 1 tablet (4 mg total) by mouth every 8 (eight) hours as needed for nausea or vomiting. 04/27/20   Franky Macho, MD      Allergies    Morphine and codeine    Review of Systems   Review of Systems  Cardiovascular:  Positive for chest pain.    Physical Exam Updated Vital Signs BP (!) 159/70 (BP Location: Right Arm)   Pulse 72   Temp 98.9 F (37.2 C) (Oral)   Resp 16   Ht 5\' 4"  (1.626 m)   Wt 90.7 kg   SpO2 100%   BMI 34.32 kg/m  Physical Exam Vitals reviewed.  Constitutional:      Appearance: She is well-developed.  Cardiovascular:     Rate and Rhythm: Regular rhythm.  Pulmonary:     Breath sounds: No decreased breath sounds or wheezing.  Chest:     Chest wall: No tenderness.  Abdominal:     Tenderness: There is no abdominal tenderness.   Neurological:     Mental Status: She is alert.     ED Results / Procedures / Treatments   Labs (all labs ordered are listed, but only abnormal results are displayed) Labs Reviewed  BASIC METABOLIC PANEL  CBC  TROPONIN I (HIGH SENSITIVITY)  TROPONIN I (HIGH SENSITIVITY)    EKG EKG Interpretation Date/Time:  Wednesday June 05 2023 15:14:05 EDT Ventricular Rate:  74 PR Interval:  134 QRS Duration:  80 QT Interval:  374 QTC Calculation: 415 R Axis:   67  Text Interpretation: Normal sinus rhythm with sinus arrhythmia Nonspecific ST abnormality Abnormal ECG No previous ECGs available Confirmed by Benjiman Core [phone]) on 06/05/2023 6:49:25 PM  Radiology DG Chest 2 View  Result Date: 06/05/2023 CLINICAL DATA:  Chest pain. EXAM: CHEST - 2 VIEW COMPARISON:  None Available. FINDINGS: The heart size and mediastinal contours are within normal limits. Normal pulmonary vascularity. Mild left basilar atelectasis. No focal consolidation, pleural effusion, or pneumothorax. No acute osseous abnormality. IMPRESSION: 1. Mild left basilar atelectasis. Electronically Signed   By: Obie Dredge M.D.   On: 06/05/2023 16:12    Procedures Procedures    Medications Ordered in ED Medications - No data to display  ED Course/ Medical Decision Making/ A&P  Medical Decision Making Amount and/or Complexity of Data Reviewed Labs: ordered. Radiology: ordered.   Patient with chest pain.  Anterior chest.  Differential diagnoses long but includes life-threatening conditions such as pneumonia and acute coronary syndrome.  Workup reassuring.  Chest x-ray reassuring and troponin negative x 2.  Doubt cardiac ischemia.  Appears stable for outpatient follow-up.  Mild hypertension can be followed.  Will discharge.        Final Clinical Impression(s) / ED Diagnoses Final diagnoses:  Nonspecific chest pain    Rx / DC Orders ED Discharge Orders     None          Benjiman Core, MD 06/06/23 0009

## 2024-03-12 ENCOUNTER — Encounter (INDEPENDENT_AMBULATORY_CARE_PROVIDER_SITE_OTHER): Payer: Self-pay | Admitting: *Deleted
